# Patient Record
Sex: Female | Born: 1950
Health system: Southern US, Community
[De-identification: ages and names within clinical notes are randomized; demographics above are authoritative.]

## PROBLEM LIST (undated history)

## (undated) DIAGNOSIS — E559 Vitamin D deficiency, unspecified: Secondary | ICD-10-CM

## (undated) DIAGNOSIS — Z860100 Personal history of colon polyps, unspecified: Secondary | ICD-10-CM

## (undated) DIAGNOSIS — J45909 Unspecified asthma, uncomplicated: Secondary | ICD-10-CM

## (undated) DIAGNOSIS — E669 Obesity, unspecified: Secondary | ICD-10-CM

## (undated) DIAGNOSIS — N952 Postmenopausal atrophic vaginitis: Secondary | ICD-10-CM

## (undated) DIAGNOSIS — T8859XA Other complications of anesthesia, initial encounter: Secondary | ICD-10-CM

## (undated) DIAGNOSIS — C801 Malignant (primary) neoplasm, unspecified: Secondary | ICD-10-CM

## (undated) DIAGNOSIS — Z9889 Other specified postprocedural states: Secondary | ICD-10-CM

## (undated) DIAGNOSIS — R112 Nausea with vomiting, unspecified: Secondary | ICD-10-CM

## (undated) DIAGNOSIS — I10 Essential (primary) hypertension: Secondary | ICD-10-CM

## (undated) DIAGNOSIS — F329 Major depressive disorder, single episode, unspecified: Secondary | ICD-10-CM

## (undated) DIAGNOSIS — E119 Type 2 diabetes mellitus without complications: Secondary | ICD-10-CM

## (undated) DIAGNOSIS — E785 Hyperlipidemia, unspecified: Secondary | ICD-10-CM

## (undated) DIAGNOSIS — F32A Depression, unspecified: Secondary | ICD-10-CM

## (undated) DIAGNOSIS — T4145XA Adverse effect of unspecified anesthetic, initial encounter: Secondary | ICD-10-CM

## (undated) HISTORY — PX: FACIAL COSMETIC SURGERY: SHX629

## (undated) HISTORY — DX: Personal history of colonic polyps: Z86.010

## (undated) HISTORY — DX: Essential (primary) hypertension: I10

## (undated) HISTORY — PX: GASTRIC BYPASS: SHX52

## (undated) HISTORY — DX: Obesity, unspecified: E66.9

## (undated) HISTORY — DX: Hyperlipidemia, unspecified: E78.5

## (undated) HISTORY — PX: NASAL SINUS SURGERY: SHX719

## (undated) HISTORY — DX: Major depressive disorder, single episode, unspecified: F32.9

## (undated) HISTORY — PX: ABDOMINAL HYSTERECTOMY: SHX81

## (undated) HISTORY — DX: Type 2 diabetes mellitus without complications: E11.9

## (undated) HISTORY — DX: Personal history of colon polyps, unspecified: Z86.0100

## (undated) HISTORY — DX: Depression, unspecified: F32.A

## (undated) HISTORY — DX: Postmenopausal atrophic vaginitis: N95.2

## (undated) HISTORY — DX: Malignant (primary) neoplasm, unspecified: C80.1

## (undated) HISTORY — DX: Vitamin D deficiency, unspecified: E55.9

## (undated) HISTORY — DX: Unspecified asthma, uncomplicated: J45.909

## (undated) HISTORY — PX: COLONOSCOPY: SHX174

## (undated) HISTORY — PX: BREAST CYST ASPIRATION: SHX578

---
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
## 1998-02-16 ENCOUNTER — Other Ambulatory Visit: Admission: RE | Admit: 1998-02-16 | Discharge: 1998-02-16 | Payer: Self-pay | Admitting: Obstetrics & Gynecology

## 1998-03-29 ENCOUNTER — Ambulatory Visit (HOSPITAL_COMMUNITY): Admission: RE | Admit: 1998-03-29 | Discharge: 1998-03-29 | Payer: Self-pay | Admitting: Obstetrics & Gynecology

## 2001-06-27 ENCOUNTER — Encounter: Payer: Self-pay | Admitting: Family Medicine

## 2001-06-27 ENCOUNTER — Encounter: Admission: RE | Admit: 2001-06-27 | Discharge: 2001-06-27 | Payer: Self-pay | Admitting: Family Medicine

## 2001-08-22 ENCOUNTER — Inpatient Hospital Stay (HOSPITAL_COMMUNITY): Admission: RE | Admit: 2001-08-22 | Discharge: 2001-08-25 | Payer: Self-pay | Admitting: Obstetrics & Gynecology

## 2001-08-22 ENCOUNTER — Encounter (INDEPENDENT_AMBULATORY_CARE_PROVIDER_SITE_OTHER): Payer: Self-pay

## 2002-07-28 ENCOUNTER — Other Ambulatory Visit: Admission: RE | Admit: 2002-07-28 | Discharge: 2002-07-28 | Payer: Self-pay | Admitting: Obstetrics & Gynecology

## 2003-11-06 ENCOUNTER — Other Ambulatory Visit: Admission: RE | Admit: 2003-11-06 | Discharge: 2003-11-06 | Payer: Self-pay | Admitting: Obstetrics & Gynecology

## 2005-03-01 ENCOUNTER — Other Ambulatory Visit: Admission: RE | Admit: 2005-03-01 | Discharge: 2005-03-01 | Payer: Self-pay | Admitting: Obstetrics & Gynecology

## 2005-09-06 ENCOUNTER — Encounter: Payer: Self-pay | Admitting: Obstetrics & Gynecology

## 2007-03-12 ENCOUNTER — Encounter: Admission: RE | Admit: 2007-03-12 | Discharge: 2007-03-12 | Payer: Self-pay | Admitting: Family Medicine

## 2007-06-24 ENCOUNTER — Encounter: Admission: RE | Admit: 2007-06-24 | Discharge: 2007-06-24 | Payer: Self-pay | Admitting: Family Medicine

## 2008-05-07 ENCOUNTER — Encounter: Admission: RE | Admit: 2008-05-07 | Discharge: 2008-05-07 | Payer: Self-pay | Admitting: Family Medicine

## 2009-07-29 ENCOUNTER — Encounter: Admission: RE | Admit: 2009-07-29 | Discharge: 2009-07-29 | Payer: Self-pay | Admitting: Family Medicine

## 2010-02-09 ENCOUNTER — Encounter: Admission: RE | Admit: 2010-02-09 | Discharge: 2010-02-09 | Payer: Self-pay | Admitting: Family Medicine

## 2010-08-18 ENCOUNTER — Other Ambulatory Visit: Payer: Self-pay | Admitting: Family Medicine

## 2010-08-18 DIAGNOSIS — Z1231 Encounter for screening mammogram for malignant neoplasm of breast: Secondary | ICD-10-CM

## 2010-09-05 ENCOUNTER — Ambulatory Visit
Admission: RE | Admit: 2010-09-05 | Discharge: 2010-09-05 | Disposition: A | Discharge status: Home / Self Care | Payer: 59 | Source: Ambulatory Visit | Attending: Family Medicine | Admitting: Family Medicine

## 2010-09-05 DIAGNOSIS — Z1231 Encounter for screening mammogram for malignant neoplasm of breast: Secondary | ICD-10-CM

## 2010-09-23 NOTE — Op Note (Signed)
[facility] of [facility]  Patient:    REILYN, NELSON Visit Number: [id number] MRN: [id number]          Service Type: GYN Location: 9300 [id number] Attending Physician:  Minette Headland Dictated by:   Freddy Finner, M.D. Proc. Date: 08/22/01 Admit Date:  08/22/2001                             Operative Report  OPERATIVE PROCEDURE:          Total abdominal hysterectomy and bilateral salpingo-oophorectomy, with dissection and removal of large left broad ligament leiomyoma.  PREOPERATIVE DIAGNOSIS:       Fibroids.  POSTOPERATIVE DIAGNOSIS:      Fibroids.  SURGEON:                      Freddy Finner, M.D.  ASSISTANTWilley Blade, M.D.  ESTIMATED BLOOD LOSS:         250 cc.  INTRAOPERATIVE COMPLICATIONS: None.  DESCRIPTION OF PROCEDURE:     The patient was admitted on the morning of surgery.  She was given a bolus of Cefotan IV.  She was placed in PASA.  She was brought to the operating room and there placed under adequate general endotracheal anesthesia, placed in the dorsal recumbent position.  Abdomen, perineum and vagina were prepped in the usual fashion.  A Foley catheter was placed using sterile technique.  Sterile drapes were applied.  A transverse incision was made through an old lower abdominal transverse scar. The incision was carried sharply to fascia, which was entered sharply and extended to the extent of skin incision.  Left sheath was developed superiorly and inferiorly with blunt and sharp dissection.  Rectus muscles were divided in the midline. The peritoneum was elevated, entered sharply and extended bluntly and sharply to the extent of skin incision.  Manual exploration of the upper abdomen revealed no apparent abnormalities of kidneys, liver, bowel; appendix was visualized and found to be normal and left in place.  Self retaining Colver-Sullivan retractor was placed.  Broad ligaments were grasped on  either side.  The uterus itself at this prospective appeared to be normal, but there was a large, soft mass in the left broad ligament extending down lateral to the vagina.  The desired conical configuration of vaginal hand was placed to explore the mass and determine the extent of the lesion.  The procedure was then performed the LigaSure system, taking round ligaments as separate pedicles and dividing them.  Taking the infundibulopelvic ligaments and dividing them.  The dissection was carried down on the right side after releasing the bladder sharply from the cervix and left segment.  This was carried down to the level of the uterine arteries without significant difficulty.  On the left side the LigaSure system was used and applied directly adjacent to the mass itself, to develop approximately three pedicles.  The mass was then easily dissected with sharp and blunt dissection and freed up.  This allowed visualization of the uterocervical junction, and the uterus with the attached mass was amputated from the cervix.  The cervix was then grasped with a single-tooth tenaculum.  The bladder was carefully advanced off the cervix.  Cardinal ligament pedicles and uterosacral pedicles were then developed.  The uterosacrals were taken with curved Heaneys  and suture ligated with 0 Monocryl on a Heaney fashion.  This allowed excision of the cervix from the vaginal cuff, which was then closed with figure-of-eights of 0 Monocryl.  Uterosacrals were plicated in the anterior peritoneum and pulled over the apex of the vaginal cuff, with a single interrupted 0 Monocryl suture.  Hemostasis was complete.  Irrigation was carried out.  All packs, needles and instruments were removed; counts were correct.  Abdominal incision was closed in layers.  Running 0 Monocryl was used to close the peritoneum and reapproximate the rectus muscles.  Then 0 PDS was used to close the fascia, running from angle to angle.   Skin was closed with wide skin staples and 1/4 inch Steri-Strips.  The patient tolerated the operative procedure well; was taken to the recovery room in good condition. Dictated by:   Freddy Finner, M.D. Attending Physician:  Minette Headland DD:  08/22/01 TD:  08/22/01 Job: [id number]

## 2010-09-23 NOTE — Discharge Summary (Signed)
[facility] of [facility]  Patient:    Debbie Norris, Debbie Norris Visit Number: [id number] MRN: [id number]          Service Type: GYN Location: 9300 [id number] Attending Physician:  Minette Headland Dictated by:   Freddy Finner, M.D. Admit Date:  08/22/2001 Discharge Date: 08/25/2001                             Discharge Summary  DISCHARGE DIAGNOSES:          1. Large left broad ligament leiomyoma with                                  edema and degenerative changes but benign                                  histology.                               2. Complex cystic adnexal mass possibly                                  consistent with degenerative myoma.  PROCEDURES:                   Total abdominal hysterectomy and bilateral salpingo-oophorectomy.  POSTOPERATIVE COMPLICATIONS:  Ileus, resolved.  CONDITION ON DISCHARGE:       The patient is in satisfactory condition at the time of her discharge.  ACTIVITY:                     She is to have progressively increasing physical activity.  She is to avoid vaginal entry.  She is to take a regular diet.  MEDICATIONS:                  She was given Percocet to be taken every four to six hours as needed for postoperative pain.  She was given Biaxin to be taken 500 mg b.i.d. for an additional five days (this is for her concomitant sinusitis, possible bronchitis).  FOLLOW-UP:                    She is to follow up with me in the office in approximately two weeks.  DISCHARGE INSTRUCTIONS:       She is to call for fever, severe pain, or heavy bleeding.  She is to resume all of her admission medications including her oral hypoglycemics.  HISTORY OF PRESENT ILLNESS, PAST HISTORY, FAMILY HISTORY, REVIEW OF SYSTEMS, PHYSICAL EXAMINATION:         Recorded in the admission note.  PHYSICAL EXAMINATION:         Physical exam is remarkable for marked enlargement of the uterus with a large complex cystic left adnexal mass  and uterine leiomyoma which had markedly changed over a relatively short interval of time.  LABORATORY DATA:              Normal CBC on admission with hemoglobin of 12.6. Postoperatively her hemoglobin was 9.7 on the first postoperative day, with a white count of 12,400.  On the second postoperative day her white count was 11,200 with  a hemoglobin of 9.0.  On admission prothrombin time, PTT, and INR were normal.  Her admission urinalysis was normal.  HOSPITAL COURSE:              The patient was admitted on the morning of surgery.  The above-described operative procedure was accomplished without significant intraoperative difficulty, although the dissection was a little tedious and involved because of the distortion of the left broad ligament, particularly at the level of the uterine artery and cervix.  Postoperatively she did develop a temperature of 101.5 on the early morning of the second postoperative day.  At that time her white count was 11.2.  She had some fine rales at the left base and auscultation of the lungs and was complaining of a green mucous discharge from her nasal passages and with cough and a feeling of congestion.  Her abdomen at that time was also distended with increase in bowel sounds.  It was felt that in addition to an ileus the most likely etiology of her symptoms was pulmonary, and she is known to have frequent upper respiratory infections.  She was started on Biaxin at that time.  By the next day the patient was completely afebrile for 24 hours.  She was ambulating without difficulty, having adequate bowel and bladder function.  Her staples were removed.  The incision was healing well.  She was discharged home with further disposition as noted above. Dictated by:   Freddy Finner, M.D. Attending Physician:  Minette Headland DD:  09/12/01 TD:  09/16/01 Job: [id number]

## 2010-10-11 ENCOUNTER — Other Ambulatory Visit (HOSPITAL_COMMUNITY): Payer: Self-pay | Admitting: Family Medicine

## 2010-10-13 ENCOUNTER — Ambulatory Visit
Admission: RE | Admit: 2010-10-13 | Discharge: 2010-10-13 | Disposition: A | Discharge status: Home / Self Care | Payer: 59 | Source: Ambulatory Visit | Attending: Family Medicine | Admitting: Family Medicine

## 2010-10-13 ENCOUNTER — Other Ambulatory Visit: Payer: Self-pay | Admitting: Family Medicine

## 2010-10-13 DIAGNOSIS — R05 Cough: Secondary | ICD-10-CM

## 2010-11-02 ENCOUNTER — Encounter (HOSPITAL_COMMUNITY): Payer: Self-pay

## 2010-11-02 ENCOUNTER — Encounter (HOSPITAL_COMMUNITY)
Admission: RE | Admit: 2010-11-02 | Discharge: 2010-11-02 | Disposition: A | Discharge status: Home / Self Care | Payer: 59 | Source: Ambulatory Visit | Attending: Family Medicine | Admitting: Family Medicine

## 2010-11-02 DIAGNOSIS — R109 Unspecified abdominal pain: Secondary | ICD-10-CM | POA: Insufficient documentation

## 2010-11-02 MED ORDER — SINCALIDE 5 MCG IJ SOLR: 0.0200 ug/kg | Freq: Once | INTRAMUSCULAR | Status: DC

## 2010-11-02 MED ORDER — TECHNETIUM TC 99M MEBROFENIN IV KIT
5.5000 | PACK | Freq: Once | INTRAVENOUS | Status: AC | PRN
  Administered 2010-11-02: 5.5 via INTRAVENOUS

## 2012-02-01 ENCOUNTER — Other Ambulatory Visit: Payer: Self-pay | Admitting: Family Medicine

## 2012-02-01 DIAGNOSIS — Z1231 Encounter for screening mammogram for malignant neoplasm of breast: Secondary | ICD-10-CM

## 2012-03-13 ENCOUNTER — Ambulatory Visit
Admission: RE | Admit: 2012-03-13 | Discharge: 2012-03-13 | Disposition: A | Discharge status: Home / Self Care | Payer: 59 | Source: Ambulatory Visit | Attending: Family Medicine | Admitting: Family Medicine

## 2012-03-13 DIAGNOSIS — Z1231 Encounter for screening mammogram for malignant neoplasm of breast: Secondary | ICD-10-CM

## 2012-07-15 ENCOUNTER — Other Ambulatory Visit (HOSPITAL_COMMUNITY)
Admission: RE | Admit: 2012-07-15 | Discharge: 2012-07-15 | Disposition: A | Discharge status: Home / Self Care | Payer: 59 | Source: Ambulatory Visit | Attending: Family Medicine | Admitting: Family Medicine

## 2012-07-15 ENCOUNTER — Other Ambulatory Visit: Payer: Self-pay | Admitting: Family Medicine

## 2012-07-15 DIAGNOSIS — Z01419 Encounter for gynecological examination (general) (routine) without abnormal findings: Secondary | ICD-10-CM | POA: Insufficient documentation

## 2013-07-04 ENCOUNTER — Other Ambulatory Visit: Payer: Self-pay

## 2013-07-04 DIAGNOSIS — Z1231 Encounter for screening mammogram for malignant neoplasm of breast: Secondary | ICD-10-CM

## 2013-07-08 ENCOUNTER — Ambulatory Visit
Admission: RE | Admit: 2013-07-08 | Discharge: 2013-07-08 | Disposition: A | Discharge status: Home / Self Care | Payer: 59 | Source: Ambulatory Visit

## 2013-07-08 DIAGNOSIS — Z1231 Encounter for screening mammogram for malignant neoplasm of breast: Secondary | ICD-10-CM

## 2013-09-01 ENCOUNTER — Institutional Professional Consult (permissible substitution): Payer: 59 | Admitting: Internal Medicine

## 2013-09-10 ENCOUNTER — Other Ambulatory Visit: Payer: 59

## 2013-09-10 ENCOUNTER — Encounter: Payer: Self-pay | Admitting: Internal Medicine

## 2013-09-10 ENCOUNTER — Ambulatory Visit (INDEPENDENT_AMBULATORY_CARE_PROVIDER_SITE_OTHER): Payer: 59 | Admitting: Internal Medicine

## 2013-09-10 ENCOUNTER — Encounter (INDEPENDENT_AMBULATORY_CARE_PROVIDER_SITE_OTHER): Payer: Self-pay

## 2013-09-10 ENCOUNTER — Ambulatory Visit (INDEPENDENT_AMBULATORY_CARE_PROVIDER_SITE_OTHER)
Admission: RE | Admit: 2013-09-10 | Discharge: 2013-09-10 | Disposition: A | Discharge status: Home / Self Care | Payer: 59 | Source: Ambulatory Visit | Attending: Internal Medicine | Admitting: Internal Medicine

## 2013-09-10 VITALS — BP 130/86 | HR 78 | Ht 61.0 in | Wt 173.0 lb

## 2013-09-10 DIAGNOSIS — J309 Allergic rhinitis, unspecified: Secondary | ICD-10-CM

## 2013-09-10 DIAGNOSIS — J4540 Moderate persistent asthma, uncomplicated: Secondary | ICD-10-CM

## 2013-09-10 DIAGNOSIS — J45909 Unspecified asthma, uncomplicated: Secondary | ICD-10-CM

## 2013-09-10 DIAGNOSIS — J3089 Other allergic rhinitis: Principal | ICD-10-CM

## 2013-09-10 DIAGNOSIS — K219 Gastro-esophageal reflux disease without esophagitis: Secondary | ICD-10-CM

## 2013-09-10 DIAGNOSIS — J302 Other seasonal allergic rhinitis: Secondary | ICD-10-CM

## 2013-09-10 MED ORDER — MONTELUKAST SODIUM 10 MG PO TABS: 10.0000 mg | ORAL_TABLET | Freq: Every day | ORAL | Status: DC

## 2013-09-10 NOTE — Patient Instructions (Signed)
Order- lab Allergy profile    Dx Asthma   Script for Singulair to try instead of Accolate for comparison  Order CXR- dx Asthma  Order- schedule PFT

## 2013-09-10 NOTE — Progress Notes (Signed)
09/10/13- 62 yoF never smoker COMPLAINS OF:  Referred by Dr. Darcus Austin for Asthma  I had seen her husband Merry Proud for sleep apnea Diagnosed with asthma around 1995 and has been having more trouble this winter in to March. 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 Blames exposure to one room there where anesthetics are used. Chronic asthma had been well controlled until this spring. Advair and Accolate have helped. Has used rescue inhaler 5 or 6 times this spring. Without Accolate her chest gets tight in a day or 2. Triggers for asthma include strong odors, seasonal pollens, sinus infections, and the room at work which she goes into intermittently- experiencing hoarseness and cough. Nasal congestion treated with saline rinse after winter colds. Sinus surgery for mucocoel and polyps but no history of aspirin intolerance. Ipratropium nasal spray if needed. Daily Zyrtec when needed. Significant GERD, has aspirated twice. Takes protonix and sleeps on 3 pillows. Hx bariatric surgery, tonsils. Allergy skin testing positive by Dr Shaune Leeks years ago-never on vaccine. Eating walnuts once made her lips swell. Environment-house, no pets, basement, no mold or water damage  Prior to Admission medications   Medication Sig Start Date End Date Taking? Authorizing Provider  ADVAIR DISKUS 100-50 MCG/DOSE AEPB 2 puffs as needed. 08/13/13  Yes Historical Provider, MD  ALPRAZolam Duanne Moron) 0.5 MG tablet 1 tablet as needed. 08/13/13  Yes Historical Provider, MD  celecoxib (CELEBREX) 200 MG capsule 1 capsule daily. 08/22/13  Yes Historical Provider, MD  CRESTOR 5 MG tablet 1 tablet daily. 08/13/13  Yes Historical Provider, MD  FLUoxetine (PROZAC) 40 MG capsule 1 capsule daily. 08/13/13  Yes Historical Provider, MD  fluticasone (FLONASE) 50 MCG/ACT nasal spray 2 sprays 2 (two) times daily. 07/20/13  Yes Historical Provider, MD  ipratropium (ATROVENT) 0.06 % nasal spray 1 spray as needed.  06/05/13  Yes Historical  Provider, MD  metFORMIN (GLUCOPHAGE-XR) 500 MG 24 hr tablet 1 tablet daily. 08/13/13  Yes Historical Provider, MD  pantoprazole (PROTONIX) 40 MG tablet 1 tablet daily. 08/13/13  Yes Historical Provider, MD  tolterodine (DETROL LA) 4 MG 24 hr capsule 1 capsule daily. 08/13/13  Yes Historical Provider, MD  VAGIFEM 10 MCG TABS vaginal tablet 1 tablet daily. 09/08/13  Yes Historical Provider, MD  VENTOLIN HFA 108 (90 BASE) MCG/ACT inhaler 1 puff daily. 07/23/13  Yes Historical Provider, MD  Vitamin D, Ergocalciferol, (DRISDOL) 50000 UNITS CAPS capsule 1 capsule daily. 08/13/13  Yes Historical Provider, MD  zafirlukast (ACCOLATE) 20 MG tablet 1 tablet 2 (two) times daily. 08/13/13  Yes Historical Provider, MD  zolpidem (AMBIEN) 10 MG tablet 1 tablet daily. 06/30/13  Yes Historical Provider, MD  montelukast (SINGULAIR) 10 MG tablet Take 1 tablet (10 mg total) by mouth at bedtime. 09/10/13   Deneise Lever, MD   Past Medical History  Diagnosis Date  . Abdominal pain   . High blood pressure   . Cancer     basel cell  . Diabetes    Past Surgical History  Procedure Laterality Date  . Abdominal hysterectomy    . Nasal sinus surgery     Family History  Problem Relation Age of Onset  . Emphysema Paternal Aunt   . Heart disease Mother   . Heart disease Father   . Cancer Father     [place] Cell   History   Social History  . Marital Status: Married    Spouse Name: N/A    Number of Children: N/A  .  Years of Education: N/A   Occupational History  . Not on file.   Social History Main Topics  . Smoking status: Never Smoker   . Smokeless tobacco: Not on file  . Alcohol Use: Yes     Comment: social  . Drug Use: No  . Sexual Activity: Not on file   Other Topics Concern  . Not on file   Social History Narrative  . No narrative on file   ROS-see HPI Constitutional:   No-   weight loss, night sweats, fevers, chills, fatigue, lassitude. HEENT:   No-  headaches, +difficulty swallowing, tooth/dental  problems, sore throat,       +Sneezing,+ itching, +ear ache, +nasal congestion, +post nasal drip,  CV:  No-   chest pain, orthopnea, PND, swelling in lower extremities, anasarca,                                                    dizziness, palpitations Resp: + shortness of breath with exertion or at rest.              +  productive cough,  + non-productive cough,  No- coughing up of blood.              No-   change in color of mucus.  No- wheezing.   Skin: No-   rash or lesions. GI:  +  heartburn, indigestion, abdominal pain, nausea, vomiting, diarrhea,                 change in bowel habits, loss of appetite GU: No-   dysuria, change in color of urine, no urgency or frequency.  No- flank pain. MS:  +  joint pain or swelling.  No- decreased range of motion.  No- back pain. Neuro-     nothing unusual Psych:  No- change in mood or affect. + depression or anxiety.  No memory loss.  OBJ- Physical Exam General- Alert, Oriented, Affect-appropriate, Distress- none acute Skin- rash-none, lesions- none, excoriation- none Lymphadenopathy- none Head- atraumatic            Eyes- Gross vision intact, PERRLA, conjunctivae and secretions clear            Ears- Hearing, canals-normal            Nose- +mucosa red, no-Septal dev, mucus, polyps, erosion, perforation             Throat- Mallampati II , mucosa +red , drainage- none, tonsils- atrophic Neck- flexible , trachea midline, no stridor , thyroid nl, carotid no bruit Chest - symmetrical excursion , unlabored           Heart/CV- RRR , no murmur , no gallop  , no rub, nl s1 s2                           - JVD- none , edema- none, stasis changes- none, varices- none           Lung- clear to P&A, wheeze- none, cough- none , dullness-none, rub- none           Chest wall-  Abd- tender-no, distended-no, bowel sounds-present, HSM- no Br/ Gen/ Rectal- Not done, not indicated Extrem- cyanosis- none, clubbing, none, atrophy- none, strength- nl Neuro- grossly  intact to observation

## 2013-09-11 LAB — ALLERGY FULL PROFILE
Allergen,Goose feathers, e70: <0.1 kU/L
Alternaria Alternata: <0.1 kU/L
Bahia Grass: <0.1 kU/L
Bermuda Grass: <0.1 kU/L
Cat Dander: <0.1 kU/L
Curvularia lunata: <0.1 kU/L
Dog Dander: <0.1 kU/L
Fescue: <0.1 kU/L
G005 Rye, Perennial: <0.1 kU/L
Goldenrod: <0.1 kU/L
Helminthosporium halodes: <0.1 kU/L
IgE (Immunoglobulin E), Serum: 12.5 IU/mL (ref 0.0–180.0)
Lamb's Quarters: <0.1 kU/L
Stemphylium Botryosum: <0.1 kU/L
Sycamore Tree: <0.1 kU/L

## 2013-09-13 DIAGNOSIS — J449 Chronic obstructive pulmonary disease, unspecified: Secondary | ICD-10-CM | POA: Insufficient documentation

## 2013-09-13 DIAGNOSIS — J302 Other seasonal allergic rhinitis: Secondary | ICD-10-CM | POA: Insufficient documentation

## 2013-09-13 DIAGNOSIS — K219 Gastro-esophageal reflux disease without esophagitis: Secondary | ICD-10-CM | POA: Insufficient documentation

## 2013-09-13 DIAGNOSIS — J3089 Other allergic rhinitis: Principal | ICD-10-CM

## 2013-09-13 NOTE — Assessment & Plan Note (Signed)
Controlled with Zyrtec Plan-lab for allergy profile

## 2013-09-13 NOTE — Assessment & Plan Note (Addendum)
Controlled with ipratropium and Advair 100. She feels dependent on Accolate. Plan-compare Singulair to accolate, PFT, chest x-ray, GERD precautions

## 2013-09-13 NOTE — Assessment & Plan Note (Signed)
Explained difference between reflux and heartburn, role of acid blockers Plan-elevate head of bed rather than pillows

## 2013-10-17 ENCOUNTER — Telehealth: Payer: Self-pay | Admitting: Internal Medicine

## 2013-10-17 MED ORDER — MONTELUKAST SODIUM 10 MG PO TABS: 10.0000 mg | ORAL_TABLET | Freq: Every day | ORAL | Status: DC

## 2013-10-17 NOTE — Telephone Encounter (Signed)
Called spoke with pt. Aware RX has been sent in. Nothing further needed 

## 2013-10-23 ENCOUNTER — Ambulatory Visit: Payer: 59 | Admitting: Internal Medicine

## 2013-10-31 ENCOUNTER — Encounter (HOSPITAL_COMMUNITY): Payer: Self-pay | Admitting: Emergency Medicine

## 2013-10-31 ENCOUNTER — Emergency Department (HOSPITAL_COMMUNITY): Payer: PRIVATE HEALTH INSURANCE

## 2013-10-31 ENCOUNTER — Emergency Department (HOSPITAL_COMMUNITY)
Admission: EM | Admit: 2013-10-31 | Discharge: 2013-11-01 | Disposition: A | Discharge status: Home / Self Care | Payer: PRIVATE HEALTH INSURANCE | Attending: Emergency Medicine | Admitting: Emergency Medicine

## 2013-10-31 DIAGNOSIS — S0003XA Contusion of scalp, initial encounter: Secondary | ICD-10-CM | POA: Insufficient documentation

## 2013-10-31 DIAGNOSIS — E119 Type 2 diabetes mellitus without complications: Secondary | ICD-10-CM | POA: Insufficient documentation

## 2013-10-31 DIAGNOSIS — Y9210 Unspecified residential institution as the place of occurrence of the external cause: Secondary | ICD-10-CM | POA: Insufficient documentation

## 2013-10-31 DIAGNOSIS — S8002XA Contusion of left knee, initial encounter: Secondary | ICD-10-CM

## 2013-10-31 DIAGNOSIS — Z85828 Personal history of other malignant neoplasm of skin: Secondary | ICD-10-CM | POA: Insufficient documentation

## 2013-10-31 DIAGNOSIS — W1809XA Striking against other object with subsequent fall, initial encounter: Secondary | ICD-10-CM | POA: Insufficient documentation

## 2013-10-31 DIAGNOSIS — S8000XA Contusion of unspecified knee, initial encounter: Secondary | ICD-10-CM | POA: Insufficient documentation

## 2013-10-31 DIAGNOSIS — IMO0002 Reserved for concepts with insufficient information to code with codable children: Secondary | ICD-10-CM | POA: Insufficient documentation

## 2013-10-31 DIAGNOSIS — I10 Essential (primary) hypertension: Secondary | ICD-10-CM | POA: Insufficient documentation

## 2013-10-31 DIAGNOSIS — S0083XA Contusion of other part of head, initial encounter: Secondary | ICD-10-CM | POA: Insufficient documentation

## 2013-10-31 DIAGNOSIS — Y9389 Activity, other specified: Secondary | ICD-10-CM | POA: Insufficient documentation

## 2013-10-31 DIAGNOSIS — Z791 Long term (current) use of non-steroidal anti-inflammatories (NSAID): Secondary | ICD-10-CM | POA: Insufficient documentation

## 2013-10-31 DIAGNOSIS — S1093XA Contusion of unspecified part of neck, initial encounter: Principal | ICD-10-CM

## 2013-10-31 DIAGNOSIS — Z79899 Other long term (current) drug therapy: Secondary | ICD-10-CM | POA: Insufficient documentation

## 2013-10-31 DIAGNOSIS — Y990 Civilian activity done for income or pay: Secondary | ICD-10-CM | POA: Insufficient documentation

## 2013-10-31 NOTE — ED Provider Notes (Signed)
CSN: [id number]     Arrival date & time 10/31/13  2210 History   First MD Initiated Contact with Patient 10/31/13 2242     Chief Complaint  Patient presents with  . Head Injury     (Consider location/radiation/quality/duration/timing/severity/associated sxs/prior Treatment) The history is provided by the patient.  Debbie Norris is a 63 y.o. female hx of HTN, arthritis here with fall. 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 Was able to ambulate afterwards but has knee pain and was noted to have L forehead hematoma. Not on blood thinners. Denies neck pain to me. Tried to break the fall with left hand but denies pain left hand.    Past Medical History  Diagnosis Date  . Abdominal pain   . High blood pressure   . Cancer     basel cell  . Diabetes    Past Surgical History  Procedure Laterality Date  . Abdominal hysterectomy    . Nasal sinus surgery     Family History  Problem Relation Age of Onset  . Emphysema Paternal Aunt   . Heart disease Mother   . Heart disease Father   . Cancer Father     [place] Cell   History  Substance Use Topics  . Smoking status: Never Smoker   . Smokeless tobacco: Not on file  . Alcohol Use: Yes     Comment: social   OB History   Grav Para Term Preterm Abortions TAB SAB Ect Mult Living                 Review of Systems  HENT:       L forehead hematoma   Musculoskeletal:       L knee pain   All other systems reviewed and are negative.     Allergies  Phenergan  Home Medications   Prior to Admission medications   Medication Sig Start Date End Date Taking? Authorizing Provider  ADVAIR DISKUS 100-50 MCG/DOSE AEPB 2 puffs as needed. 08/13/13   Historical Provider, MD  ALPRAZolam Duanne Moron) 0.5 MG tablet 1 tablet as needed. 08/13/13   Historical Provider, MD  celecoxib (CELEBREX) 200 MG capsule 1 capsule daily. 08/22/13   Historical  Provider, MD  CRESTOR 5 MG tablet 1 tablet daily. 08/13/13   Historical Provider, MD  FLUoxetine (PROZAC) 40 MG capsule 1 capsule daily. 08/13/13   Historical Provider, MD  fluticasone (FLONASE) 50 MCG/ACT nasal spray 2 sprays 2 (two) times daily. 07/20/13   Historical Provider, MD  ipratropium (ATROVENT) 0.06 % nasal spray 1 spray as needed.  06/05/13   Historical Provider, MD  metFORMIN (GLUCOPHAGE-XR) 500 MG 24 hr tablet 1 tablet daily. 08/13/13   Historical Provider, MD  montelukast (SINGULAIR) 10 MG tablet Take 1 tablet (10 mg total) by mouth at bedtime. 10/17/13   Deneise Lever, MD  pantoprazole (PROTONIX) 40 MG tablet 1 tablet daily. 08/13/13   Historical Provider, MD  tolterodine (DETROL LA) 4 MG 24 hr capsule 1 capsule daily. 08/13/13   Historical Provider, MD  VAGIFEM 10 MCG TABS vaginal tablet 1 tablet daily. 09/08/13   Historical Provider, MD  VENTOLIN HFA 108 (90 BASE) MCG/ACT inhaler 1 puff daily. 07/23/13   Historical Provider, MD  Vitamin D, Ergocalciferol, (DRISDOL) 50000 UNITS CAPS capsule 1 capsule daily. 08/13/13   Historical Provider, MD  zafirlukast (ACCOLATE) 20 MG tablet 1 tablet  2 (two) times daily. 08/13/13   Historical Provider, MD  zolpidem (AMBIEN) 10 MG tablet 1 tablet daily. 06/30/13   Historical Provider, MD   BP 132/75  Pulse 72  Temp(Src) 98.4 F (36.9 C) (Oral)  Resp 16  SpO2 100% Physical Exam  Nursing note and vitals reviewed. Constitutional: She is oriented to person, place, and time. She appears well-developed and well-nourished.  HENT:  Head: Normocephalic.  Small L forehead hematoma   Eyes: Conjunctivae are normal. Pupils are equal, round, and reactive to light.  Neck: Normal range of motion. Neck supple.  No midline tenderness   Cardiovascular: Normal rate, regular rhythm and normal heart sounds.   Pulmonary/Chest: Effort normal and breath sounds normal.  Abdominal: Soft. Bowel sounds are normal. She exhibits no distension. There is no tenderness. There is no  rebound and no guarding.  Musculoskeletal:  L knee abrasion. Nl ROM, mildly tender. ACL/PCL intact. L hand abrasions, nontender.   Neurological: She is alert and oriented to person, place, and time. No cranial nerve deficit. Coordination normal.  Skin: Skin is warm and dry.  Psychiatric: She has a normal mood and affect. Her behavior is normal. Judgment and thought content normal.    ED Course  Procedures (including critical care time) Labs Review Labs Reviewed - No data to display  Imaging Review Ct Head Wo Contrast  10/31/2013   CLINICAL DATA:  Head injury.  Forehead hematoma.  EXAM: CT HEAD WITHOUT CONTRAST  TECHNIQUE: Contiguous axial images were obtained from the base of the skull through the vertex without intravenous contrast.  COMPARISON:  03/12/2007  FINDINGS: Skull and Sinuses:Negative for fracture or destructive process. Previous endoscopic sinus surgery with medial maxillary antrostomies and left more than right ethmoidectomies. There is minimal scattered mucosal thickening and secretions retention.  Orbits: No acute abnormality.  Brain: No evidence of acute abnormality, such as acute infarction, hemorrhage, hydrocephalus, or mass lesion/mass effect. There is mild generalized cerebral volume loss.  IMPRESSION: No acute intracranial injury.   Electronically Signed   By: Jorje Guild M.D.   On: 10/31/2013 23:58   Dg Knee Complete 4 Views Left  10/31/2013   CLINICAL DATA:  Left knee pain after fall.  EXAM: LEFT KNEE - COMPLETE 4+ VIEW  COMPARISON:  None.  FINDINGS: There is no evidence of fracture, dislocation, or joint effusion. Moderate narrowing of medial joint space is noted. Minimal spurring of patella is noted. Soft tissues are unremarkable.  IMPRESSION: Moderate degenerative joint disease is noted medially. No acute abnormality seen in the left knee.   Electronically Signed   By: Sabino Dick M.D.   On: 10/31/2013 23:53   Dg Hand Complete Left  10/31/2013   CLINICAL DATA:   Left hand pain after fall.  EXAM: LEFT HAND - COMPLETE 3+ VIEW  COMPARISON:  None.  FINDINGS: There is no evidence of fracture or dislocation. There is no evidence of arthropathy or other focal bone abnormality. Soft tissues are unremarkable.  IMPRESSION: Normal left hand.   Electronically Signed   By: Sabino Dick M.D.   On: 10/31/2013 23:51     EKG Interpretation None      MDM   Final diagnoses:  None   Debbie Norris is a 63 y.o. female here with fall. Will get CT head, knee xray, hand xray.   12:02 AM CT head, xray nl. Ambulating on the knee so I doubt tibial plateau fracture. Stable for d/c.     Wandra Arthurs, MD 11/01/13  0002 

## 2013-10-31 NOTE — ED Notes (Addendum)
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 Abrasions to left knee, left hand and hematoma to left forehead. denies LOC.  Pt is alert and oriented. Denies nausea. Reports neck soreness.  PT reports taking celebrex, but not blodd thinner.

## 2013-10-31 NOTE — ED Notes (Signed)
Pt alert, NAD, calm, interactive, resps e/u, speaking in clear complete sentences, sitting upright in stretcher, no dyspnea noted. To CT/xray. Pt mentions L ulnar hand pain at PIP. Abrasion, swelling and bruising noted.

## 2013-11-01 NOTE — ED Notes (Signed)
Declines w/c

## 2013-11-01 NOTE — Discharge Instructions (Signed)
Continue taking celebrex for pain.   You can also take tylenol for pain.   Keep leg elevated.   Follow up with your doctor.   Return to ER if you have severe pain, unable to walk, headaches, vomiting.

## 2013-11-01 NOTE — ED Notes (Signed)
The tech cleaned patient's abrasions on the left side: elbow, hand, finger and the knee. The tech has reported to the RN in charge.

## 2013-11-25 ENCOUNTER — Ambulatory Visit (INDEPENDENT_AMBULATORY_CARE_PROVIDER_SITE_OTHER): Payer: 59 | Admitting: Internal Medicine

## 2013-11-25 ENCOUNTER — Encounter: Payer: Self-pay | Admitting: Internal Medicine

## 2013-11-25 VITALS — BP 122/62 | HR 93 | Ht 61.0 in | Wt 165.0 lb

## 2013-11-25 DIAGNOSIS — J3089 Other allergic rhinitis: Secondary | ICD-10-CM

## 2013-11-25 DIAGNOSIS — J4540 Moderate persistent asthma, uncomplicated: Secondary | ICD-10-CM

## 2013-11-25 DIAGNOSIS — J302 Other seasonal allergic rhinitis: Secondary | ICD-10-CM

## 2013-11-25 DIAGNOSIS — J45909 Unspecified asthma, uncomplicated: Secondary | ICD-10-CM

## 2013-11-25 DIAGNOSIS — J309 Allergic rhinitis, unspecified: Secondary | ICD-10-CM

## 2013-11-25 DIAGNOSIS — R011 Cardiac murmur, unspecified: Secondary | ICD-10-CM | POA: Insufficient documentation

## 2013-11-25 LAB — PULMONARY FUNCTION TEST
DL/VA % PRED: 114 %
DL/VA: 5.02 ml/min/mmHg/L
DLCO UNC: 22.39 ml/min/mmHg
DLCO unc % pred: 110 %
FEF 25-75 PRE: 2.84 L/s
FEF 25-75 Post: 2.81 L/sec
FEF2575-%CHANGE-POST: -1 %
FEF2575-%PRED-PRE: 135 %
FEF2575-%Pred-Post: 134 %
FEV1-%Change-Post: -1 %
FEV1-%PRED-PRE: 113 %
FEV1-%Pred-Post: 112 %
FEV1-POST: 2.5 L
FEV1-PRE: 2.53 L
FEV1FVC-%CHANGE-POST: -1 %
FEV1FVC-%Pred-Pre: 104 %
FEV6-%CHANGE-POST: 0 %
FEV6-%Pred-Post: 110 %
FEV6-%Pred-Pre: 109 %
FEV6-Post: 3.06 L
FEV6-Pre: 3.04 L
FEV6FVC-%Change-Post: 0 %
FEV6FVC-%Pred-Post: 102 %
FEV6FVC-%Pred-Pre: 101 %
FVC-%Change-Post: 0 %
FVC-%PRED-POST: 107 %
FVC-%Pred-Pre: 107 %
FVC-Post: 3.1 L
FVC-Pre: 3.1 L
POST FEV1/FVC RATIO: 80 %
PRE FEV1/FVC RATIO: 81 %
Post FEV6/FVC ratio: 99 %
Pre FEV6/FVC Ratio: 98 %
RV % pred: 44 %
RV: 0.83 L
TLC % pred: 107 %
TLC: 4.94 L

## 2013-11-25 MED ORDER — ALBUTEROL SULFATE HFA 108 (90 BASE) MCG/ACT IN AERS: INHALATION_SPRAY | RESPIRATORY_TRACT | Status: DC

## 2013-11-25 NOTE — Progress Notes (Signed)
09/10/13- 62 yoF never smoker COMPLAINS OF:  Referred by Dr. Darcus Austin for Asthma  I had seen her husband Merry Proud for sleep apnea Diagnosed with asthma around 1995 and has been having more trouble this winter in to March. 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 Blames exposure to one room there where anesthetics are used. Chronic asthma had been well controlled until this spring. Advair and Accolate have helped. Has used rescue inhaler 5 or 6 times this spring. Without Accolate her chest gets tight in a day or 2. Triggers for asthma include strong odors, seasonal pollens, sinus infections, and the room at work which she goes into intermittently- experiencing hoarseness and cough. Nasal congestion treated with saline rinse after winter colds. Sinus surgery for mucocoel and polyps but no history of aspirin intolerance. Ipratropium nasal spray if needed. Daily Zyrtec when needed. Significant GERD, has aspirated twice. Takes protonix and sleeps on 3 pillows. Hx bariatric surgery, tonsils. Allergy skin testing positive by Dr Shaune Leeks years ago-never on vaccine. Eating walnuts once made her lips swell. Environment-house, no pets, basement, no mold or water damage  11/25/13- 63 yoF never smoker COMPLAINS OF:  Referred by Dr. Darcus Austin for Asthma, allergic rhinitis, complicated by GERD  I had seen her husband Merry Proud for sleep apnea FOLLOWS FOR: Pt states her breathing is doing pretty good; coughing up white colored phlegm for a couple of months now-this is new-never had previously(mainly in AM but could go through out the day). Review PFT results with patient. She tends to expect an asthma flare each fall and spring with season change. Allergy Profile-09/10/2013-negative-total IgE 12.5 with no specific elevations PFT 11/25/13-within normal limits with insignificant response to bronchodilator CXR 09/10/13-  IMPRESSION: No active cardiopulmonary disease.  Electronically Signed  By: Marcello Moores  Register  On: 09/10/2013 13:38   ROS-see HPI Constitutional:   No-   weight loss, night sweats, fevers, chills, fatigue, lassitude. HEENT:   No-  headaches, +difficulty swallowing, tooth/dental problems, sore throat,       Sneezing, itching, +ear ache, nasal congestion, post nasal drip,  CV:  No-   chest pain, orthopnea, PND, swelling in lower extremities, anasarca,                                                    dizziness, palpitations Resp: no-shortness of breath with exertion or at rest.              +productive cough, no-non-productive cough,  No- coughing up of blood.              No-   change in color of mucus.  No- wheezing.   Skin: No-   rash or lesions. GI:  No-heartburn, indigestion, abdominal pain, nausea, vomiting, diarrhea,                 change in bowel habits, loss of appetite GU: No-   dysuria, change in color of urine, no urgency or frequency.  No- flank pain. MS:  +  joint pain or swelling.  No- decreased range of motion.  No- back pain. Neuro-     nothing unusual Psych:  No- change in mood or affect. + depression or anxiety.  No memory loss.  OBJ- Physical Exam General- Alert, Oriented, Affect-appropriate, Distress- none acute Skin- rash-none, lesions- none, excoriation-  none Lymphadenopathy- none Head- atraumatic            Eyes- Gross vision intact, PERRLA, conjunctivae and secretions clear            Ears- Hearing, canals-normal            Nose- mucosa normal, no-Septal dev, mucus, polyps, erosion, perforation             Throat- Mallampati II , mucosa +red , drainage- none, tonsils- atrophic Neck- flexible , trachea midline, no stridor , thyroid nl, carotid no bruit Chest - symmetrical excursion , unlabored           Heart/CV- RRR ,  Murmur +2/6 syst LUSB , no gallop  , no rub, nl s1 s2                           - JVD- none , edema- none, stasis changes- none, varices- none           Lung- clear to P&A, wheeze- none, cough- none , dullness-none, rub- none            Chest wall-  Abd-  Br/ Gen/ Rectal- Not done, not indicated Extrem- cyanosis- none, clubbing, none, atrophy- none, strength- nl Neuro- grossly intact to observation

## 2013-11-25 NOTE — Progress Notes (Signed)
PFT done today. 

## 2013-11-25 NOTE — Assessment & Plan Note (Signed)
Asthma control is much improved with normal PFTs. We discussed her seasonal pattern which may reflect virus infection given the normal allergy profile results. For now she is comfortable off of her maintenance controller Advair but continues Singulair and has a rescue inhaler Plan-scheduled return in December

## 2013-11-25 NOTE — Assessment & Plan Note (Signed)
I did not notice this murmur last visit and she was unaware of it. She has had previous echocardiogram with results available to her primary physician. I doubt this is hemodynamically significant

## 2013-11-25 NOTE — Assessment & Plan Note (Signed)
Controlled now in mid-summer

## 2013-11-25 NOTE — Patient Instructions (Addendum)
Script sent refilling rescue inhaler  Ok to try off Advair and Flonase.    Restart if you see trouble coming, before you get really bad.  Please call as needed

## 2014-01-14 ENCOUNTER — Ambulatory Visit (HOSPITAL_COMMUNITY): Payer: 59 | Attending: Family Medicine

## 2014-01-14 ENCOUNTER — Other Ambulatory Visit (HOSPITAL_COMMUNITY): Payer: Self-pay | Admitting: Family Medicine

## 2014-01-14 DIAGNOSIS — R011 Cardiac murmur, unspecified: Secondary | ICD-10-CM | POA: Diagnosis present

## 2014-01-14 DIAGNOSIS — J45909 Unspecified asthma, uncomplicated: Secondary | ICD-10-CM | POA: Insufficient documentation

## 2014-01-14 DIAGNOSIS — K219 Gastro-esophageal reflux disease without esophagitis: Secondary | ICD-10-CM | POA: Insufficient documentation

## 2014-01-14 DIAGNOSIS — I517 Cardiomegaly: Secondary | ICD-10-CM | POA: Diagnosis not present

## 2014-01-14 DIAGNOSIS — I379 Nonrheumatic pulmonary valve disorder, unspecified: Secondary | ICD-10-CM | POA: Diagnosis not present

## 2014-01-14 DIAGNOSIS — I359 Nonrheumatic aortic valve disorder, unspecified: Secondary | ICD-10-CM | POA: Insufficient documentation

## 2014-01-14 NOTE — Progress Notes (Signed)
2D Echo completed. 01/14/2014

## 2014-01-15 ENCOUNTER — Telehealth: Payer: Self-pay | Admitting: Internal Medicine

## 2014-01-15 MED ORDER — MONTELUKAST SODIUM 10 MG PO TABS: 10.0000 mg | ORAL_TABLET | Freq: Every day | ORAL | Status: DC

## 2014-01-15 NOTE — Telephone Encounter (Signed)
Called spoke with pt. She is aware RX has been sent in. Nothing further needed

## 2014-01-16 ENCOUNTER — Other Ambulatory Visit: Payer: Self-pay | Admitting: Internal Medicine

## 2014-04-22 ENCOUNTER — Ambulatory Visit: Payer: Self-pay | Admitting: Internal Medicine

## 2014-04-23 ENCOUNTER — Other Ambulatory Visit: Payer: Self-pay | Admitting: Family Medicine

## 2014-04-23 DIAGNOSIS — R1310 Dysphagia, unspecified: Secondary | ICD-10-CM

## 2014-04-24 ENCOUNTER — Encounter (INDEPENDENT_AMBULATORY_CARE_PROVIDER_SITE_OTHER): Payer: Self-pay

## 2014-04-24 ENCOUNTER — Ambulatory Visit
Admission: RE | Admit: 2014-04-24 | Discharge: 2014-04-24 | Disposition: A | Discharge status: Home / Self Care | Payer: 59 | Source: Ambulatory Visit | Attending: Family Medicine | Admitting: Family Medicine

## 2014-04-24 DIAGNOSIS — R1310 Dysphagia, unspecified: Secondary | ICD-10-CM

## 2014-06-03 ENCOUNTER — Ambulatory Visit: Payer: Self-pay | Admitting: Internal Medicine

## 2014-06-10 ENCOUNTER — Encounter: Payer: Self-pay | Admitting: Internal Medicine

## 2014-06-10 ENCOUNTER — Ambulatory Visit (INDEPENDENT_AMBULATORY_CARE_PROVIDER_SITE_OTHER): Payer: 59 | Admitting: Internal Medicine

## 2014-06-10 VITALS — BP 124/62 | HR 94 | Ht 61.0 in | Wt 168.8 lb

## 2014-06-10 DIAGNOSIS — J309 Allergic rhinitis, unspecified: Secondary | ICD-10-CM

## 2014-06-10 DIAGNOSIS — J4520 Mild intermittent asthma, uncomplicated: Secondary | ICD-10-CM

## 2014-06-10 DIAGNOSIS — J302 Other seasonal allergic rhinitis: Secondary | ICD-10-CM

## 2014-06-10 DIAGNOSIS — R011 Cardiac murmur, unspecified: Secondary | ICD-10-CM

## 2014-06-10 DIAGNOSIS — J3089 Other allergic rhinitis: Secondary | ICD-10-CM

## 2014-06-10 MED ORDER — MONTELUKAST SODIUM 10 MG PO TABS: ORAL_TABLET | ORAL | Status: DC

## 2014-06-10 NOTE — Patient Instructions (Signed)
Script sent for singulair refill  Ok to continue present meds, including Advair  Please call if we can help

## 2014-06-10 NOTE — Progress Notes (Signed)
09/10/13- 62 yoF never smoker COMPLAINS OF:  Referred by Dr. Darcus Austin for Asthma  I had seen her husband Merry Proud for sleep apnea Diagnosed with asthma around 1995 and has been having more trouble this winter in to March. 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 Blames exposure to one room there where anesthetics are used. Chronic asthma had been well controlled until this spring. Advair and Accolate have helped. Has used rescue inhaler 5 or 6 times this spring. Without Accolate her chest gets tight in a day or 2. Triggers for asthma include strong odors, seasonal pollens, sinus infections, and the room at work which she goes into intermittently- experiencing hoarseness and cough. Nasal congestion treated with saline rinse after winter colds. Sinus surgery for mucocoel and polyps but no history of aspirin intolerance. Ipratropium nasal spray if needed. Daily Zyrtec when needed. Significant GERD, has aspirated twice. Takes protonix and sleeps on 3 pillows. Hx bariatric surgery, tonsils. Allergy skin testing positive by Dr Shaune Leeks years ago-never on vaccine. Eating walnuts once made her lips swell. Environment-house, no pets, basement, no mold or water damage  11/25/13- 63 yoF never smoker COMPLAINS OF:  Referred by Dr. Darcus Austin for Asthma, allergic rhinitis, complicated by GERD  I had seen her husband Merry Proud for sleep apnea FOLLOWS FOR: Pt states her breathing is doing pretty good; coughing up white colored phlegm for a couple of months now-this is new-never had previously(mainly in AM but could go through out the day). Review PFT results with patient. She tends to expect an asthma flare each fall and spring with season change. Allergy Profile-09/10/2013-negative-total IgE 12.5 with no specific elevations PFT 11/25/13-within normal limits with insignificant response to bronchodilator CXR 09/10/13-  IMPRESSION: No active cardiopulmonary disease.  Electronically Signed  By: Marcello Moores  Register  On: 09/10/2013 13:38   06/10/14-63 yoF never smoker followed for Asthma, allergic rhinitis, complicated by GERD, Aortic Regurgitation FOLLOW FOR:  allergies are doing pretty well, started back on flonase.  Had a cold about a month ago, still spitting up about a teaspoon at a time of thick mucus, sometimes colored yellow or green, but mostly white.  coughing, sometimes causing metal taste in mouth Restarted Flonase ECHO showed Aortic Regurg  ROS-see HPI Constitutional:   No-   weight loss, night sweats, fevers, chills, fatigue, lassitude. HEENT:   No-  headaches, +difficulty swallowing, tooth/dental problems, sore throat,       Sneezing, itching, +ear ache, nasal congestion, post nasal drip,  CV:  No-   chest pain, orthopnea, PND, swelling in lower extremities, anasarca,                                                    dizziness, palpitations Resp: no-shortness of breath with exertion or at rest.              +productive cough, no-non-productive cough,  No- coughing up of blood.              No-   change in color of mucus.  No- wheezing.   Skin: No-   rash or lesions. GI:  No-heartburn, indigestion, abdominal pain, nausea, vomiting, diarrhea,                 change in bowel habits, loss of appetite GU: No-   dysuria, change  in color of urine, no urgency or frequency.  No- flank pain. MS:  +  joint pain or swelling.  No- decreased range of motion.  No- back pain. Neuro-     nothing unusual Psych:  No- change in mood or affect. + depression or anxiety.  No memory loss.  OBJ- Physical Exam General- Alert, Oriented, Affect-appropriate, Distress- none acute Skin- rash-none, lesions- none, excoriation- none Lymphadenopathy- none Head- atraumatic            Eyes- Gross vision intact, PERRLA, conjunctivae and secretions clear            Ears- Hearing, canals-normal            Nose- mucosa normal, no-Septal dev, mucus, polyps, erosion, perforation             Throat- Mallampati II ,  mucosa +red , drainage- none, tonsils- atrophic Neck- flexible , trachea midline, no stridor , thyroid nl, carotid no bruit Chest - symmetrical excursion , unlabored           Heart/CV- RRR ,  Murmur +2/6 syst A.I. , no gallop  , no rub, nl s1 s2                           - JVD- none , edema- none, stasis changes- none, varices- none           Lung- clear to P&A, wheeze- none, cough+slight , dullness-none, rub- none           Chest wall-  Abd-  Br/ Gen/ Rectal- Not done, not indicated Extrem- cyanosis- none, clubbing, none, atrophy- none, strength- nl Neuro- grossly intact to observation

## 2014-06-11 ENCOUNTER — Other Ambulatory Visit (HOSPITAL_COMMUNITY): Payer: Self-pay | Admitting: Gastroenterology

## 2014-06-11 DIAGNOSIS — R1310 Dysphagia, unspecified: Secondary | ICD-10-CM

## 2014-06-12 ENCOUNTER — Telehealth: Payer: Self-pay | Admitting: Internal Medicine

## 2014-06-15 NOTE — Telephone Encounter (Signed)
Called and spoke to pt. Pt stated she went to an UC over the weekend and received prednisone and an abx. Pt stated she is now barely getting her voice back and starting to feel better. Pt denied any further questions or concerns at this time.

## 2014-06-18 ENCOUNTER — Ambulatory Visit (HOSPITAL_COMMUNITY): Admission: RE | Admit: 2014-06-18 | Payer: 59 | Source: Ambulatory Visit

## 2014-06-18 ENCOUNTER — Other Ambulatory Visit (HOSPITAL_COMMUNITY): Payer: Self-pay

## 2014-06-22 NOTE — Assessment & Plan Note (Signed)
Slow to resolve winter chest cold, but doing much better. Plan- Advair renew

## 2014-06-22 NOTE — Assessment & Plan Note (Signed)
Flonase plus occ antihistamine should be sufficient Plan watch as spring approaches

## 2014-06-22 NOTE — Assessment & Plan Note (Signed)
Mild AI noted for documentation

## 2014-06-24 ENCOUNTER — Ambulatory Visit (HOSPITAL_COMMUNITY)
Admission: RE | Admit: 2014-06-24 | Discharge: 2014-06-24 | Disposition: A | Discharge status: Home / Self Care | Payer: 59 | Source: Ambulatory Visit | Attending: Gastroenterology | Admitting: Gastroenterology

## 2014-06-24 DIAGNOSIS — R1310 Dysphagia, unspecified: Secondary | ICD-10-CM | POA: Diagnosis present

## 2014-07-13 ENCOUNTER — Other Ambulatory Visit: Payer: Self-pay | Admitting: Occupational Medicine

## 2014-07-13 ENCOUNTER — Ambulatory Visit: Payer: Self-pay

## 2014-07-13 DIAGNOSIS — R52 Pain, unspecified: Secondary | ICD-10-CM

## 2014-09-23 ENCOUNTER — Encounter: Payer: Self-pay | Admitting: Internal Medicine

## 2014-09-23 ENCOUNTER — Ambulatory Visit (INDEPENDENT_AMBULATORY_CARE_PROVIDER_SITE_OTHER): Payer: 59 | Admitting: Internal Medicine

## 2014-09-23 VITALS — BP 110/60 | HR 100 | Ht 61.0 in | Wt 164.0 lb

## 2014-09-23 DIAGNOSIS — J449 Chronic obstructive pulmonary disease, unspecified: Secondary | ICD-10-CM

## 2014-09-23 DIAGNOSIS — K219 Gastro-esophageal reflux disease without esophagitis: Secondary | ICD-10-CM

## 2014-09-23 MED ORDER — FLUCONAZOLE 150 MG PO TABS: ORAL_TABLET | ORAL | Status: DC

## 2014-09-23 MED ORDER — FLUTICASONE-SALMETEROL 250-50 MCG/DOSE IN AEPB: INHALATION_SPRAY | RESPIRATORY_TRACT | Status: DC

## 2014-09-23 MED ORDER — AZITHROMYCIN 250 MG PO TABS: ORAL_TABLET | ORAL | Status: DC

## 2014-09-23 NOTE — Assessment & Plan Note (Signed)
She has recognized reflux and at least one aspiration event. Modified barium swallow noted upper esophageal dysmotility without definite reflux or laryngeal penetration. Plan-recommended elevation of head of bed.

## 2014-09-23 NOTE — Assessment & Plan Note (Signed)
She recognizes that Advair helps. Because of her upcoming long vacation with plane flights, we're going to increase Advair to 250/50 and give her Z-Pak to carry with Diflucan at her request.

## 2014-09-23 NOTE — Progress Notes (Signed)
09/10/13- 62 yoF never smoker COMPLAINS OF:  Referred by Dr. Darcus Austin for Asthma  I had seen her husband Merry Proud for sleep apnea Diagnosed with asthma around 1995 and has been having more trouble this winter in to March. 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 Blames exposure to one room there where anesthetics are used. Chronic asthma had been well controlled until this spring. Advair and Accolate have helped. Has used rescue inhaler 5 or 6 times this spring. Without Accolate her chest gets tight in a day or 2. Triggers for asthma include strong odors, seasonal pollens, sinus infections, and the room at work which she goes into intermittently- experiencing hoarseness and cough. Nasal congestion treated with saline rinse after winter colds. Sinus surgery for mucocoel and polyps but no history of aspirin intolerance. Ipratropium nasal spray if needed. Daily Zyrtec when needed. Significant GERD, has aspirated twice. Takes protonix and sleeps on 3 pillows. Hx bariatric surgery, tonsils. Allergy skin testing positive by Dr Shaune Leeks years ago-never on vaccine. Eating walnuts once made her lips swell. Environment-house, no pets, basement, no mold or water damage  11/25/13- 63 yoF never smoker COMPLAINS OF:  Referred by Dr. Darcus Austin for Asthma, allergic rhinitis, complicated by GERD  I had seen her husband Merry Proud for sleep apnea FOLLOWS FOR: Pt states her breathing is doing pretty good; coughing up white colored phlegm for a couple of months now-this is new-never had previously(mainly in AM but could go through out the day). Review PFT results with patient. She tends to expect an asthma flare each fall and spring with season change. Allergy Profile-09/10/2013-negative-total IgE 12.5 with no specific elevations PFT 11/25/13-within normal limits with insignificant response to bronchodilator CXR 09/10/13-  IMPRESSION: No active cardiopulmonary disease.  Electronically Signed  By: Marcello Moores  Register  On: 09/10/2013 13:38   06/10/14-63 yoF never smoker followed for Asthma, allergic rhinitis, complicated by GERD, Aortic Regurgitation FOLLOW FOR:  allergies are doing pretty well, started back on flonase.  Had a cold about a month ago, still spitting up about a teaspoon at a time of thick mucus, sometimes colored yellow or green, but mostly white.  coughing, sometimes causing metal taste in mouth Restarted Flonase ECHO showed Aortic Regurg  09/23/14-64 yoF never smoker followed for Asthma, allergic rhinitis, complicated by GERD, Aortic Regurgitation Follows for No tight chest feeling; forceful cough with thick white mucus  Persistent cough, gradually worse over several months. Her GI doctor sent her for swallowing evaluation with upper esophageal dysfunction but no frank aspiration. She recognizes occasional reflux and at least one aspiration event. Going to [place] for 23 days, leaving next week. Says she gets bronchitis after airplane flights. We discussed management options. Chest x-ray and PFT were okay. She reports she is better off with Advair. CXR 07/13/14 IMPRESSION: No acute cardiopulmonary abnormality. Electronically Signed  By: Genevie Ann M.D.  On: 07/13/2014 14:54  ROS-see HPI Constitutional:   No-   weight loss, night sweats, fevers, chills, fatigue, lassitude. HEENT:   No-  headaches, +difficulty swallowing, tooth/dental problems, sore throat,       Sneezing, itching, +ear ache, nasal congestion, post nasal drip,  CV:  No-   chest pain, orthopnea, PND, swelling in lower extremities, anasarca,  dizziness, palpitations Resp: no-shortness of breath with exertion or at rest.              +productive cough, +non-productive cough,  No- coughing up of blood.              No-   change in color of mucus.  No- wheezing.   Skin: No-   rash or lesions. GI:  No-heartburn, indigestion, abdominal pain, nausea, vomiting, GU:  MS:  +   joint pain or swelling.  No- decreased range of motion.  No- back pain. Neuro-     nothing unusual Psych:  No- change in mood or affect. + depression or anxiety.  No memory loss.  OBJ- Physical Exam General- Alert, Oriented, Affect-appropriate, Distress- none acute Skin- rash-none, lesions- none, excoriation- none Lymphadenopathy- none Head- atraumatic            Eyes- Gross vision intact, PERRLA, conjunctivae and secretions clear            Ears- Hearing, canals-normal            Nose- mucosa normal, no-Septal dev, mucus, polyps, erosion, perforation             Throat- Mallampati II , mucosa clear, drainage- none, tonsils- atrophic Neck- flexible , trachea midline, no stridor , thyroid nl, carotid no bruit Chest - symmetrical excursion , unlabored           Heart/CV- RRR ,  Murmur +1/6 syst A.I. , no gallop  , no rub, nl s1 s2                           - JVD- none , edema- none, stasis changes- none, varices- none           Lung- clear to P&A, wheeze- none, cough+slight , dullness-none, rub- none           Chest wall-  Abd-  Br/ Gen/ Rectal- Not done, not indicated Extrem- cyanosis- none, clubbing, none, atrophy- none, strength- nl Neuro- grossly intact to observation

## 2014-09-23 NOTE — Patient Instructions (Addendum)
Script for Zpak with extra to carry  Scfript for diflucan  Script to increase Advair to 250/ 50 1 pff, then rinse, twice daily  Suggest you raise the head of your bedframe on a brick under each head leg, to reduce chance of reflux at night

## 2014-11-03 ENCOUNTER — Telehealth: Payer: Self-pay | Admitting: Internal Medicine

## 2014-11-03 MED ORDER — FLUTICASONE-SALMETEROL 250-50 MCG/DOSE IN AEPB: INHALATION_SPRAY | RESPIRATORY_TRACT | Status: DC

## 2014-11-03 MED ORDER — MONTELUKAST SODIUM 10 MG PO TABS: ORAL_TABLET | ORAL | Status: DC

## 2014-11-03 NOTE — Telephone Encounter (Signed)
Spoke with pt. Confirmed RX's needed. These have been sent to the pharmacy. Nothing further needed

## 2014-12-09 ENCOUNTER — Ambulatory Visit: Payer: Self-pay | Admitting: Internal Medicine

## 2014-12-14 ENCOUNTER — Other Ambulatory Visit: Payer: Self-pay | Admitting: Internal Medicine

## 2014-12-18 ENCOUNTER — Ambulatory Visit
Admission: RE | Admit: 2014-12-18 | Discharge: 2014-12-18 | Disposition: A | Discharge status: Home / Self Care | Payer: 59 | Source: Ambulatory Visit

## 2014-12-18 ENCOUNTER — Other Ambulatory Visit: Payer: Self-pay

## 2014-12-18 DIAGNOSIS — Z1231 Encounter for screening mammogram for malignant neoplasm of breast: Secondary | ICD-10-CM

## 2014-12-23 ENCOUNTER — Other Ambulatory Visit (HOSPITAL_COMMUNITY): Payer: Self-pay | Admitting: Family Medicine

## 2014-12-23 DIAGNOSIS — I351 Nonrheumatic aortic (valve) insufficiency: Secondary | ICD-10-CM

## 2014-12-24 ENCOUNTER — Other Ambulatory Visit (HOSPITAL_COMMUNITY): Payer: Self-pay

## 2014-12-29 ENCOUNTER — Other Ambulatory Visit: Payer: Self-pay

## 2014-12-29 ENCOUNTER — Ambulatory Visit (HOSPITAL_COMMUNITY): Payer: 59 | Attending: Cardiovascular Disease

## 2014-12-29 DIAGNOSIS — I371 Nonrheumatic pulmonary valve insufficiency: Secondary | ICD-10-CM | POA: Insufficient documentation

## 2014-12-29 DIAGNOSIS — I517 Cardiomegaly: Secondary | ICD-10-CM | POA: Insufficient documentation

## 2014-12-29 DIAGNOSIS — I071 Rheumatic tricuspid insufficiency: Secondary | ICD-10-CM | POA: Diagnosis not present

## 2014-12-29 DIAGNOSIS — I359 Nonrheumatic aortic valve disorder, unspecified: Secondary | ICD-10-CM | POA: Diagnosis present

## 2014-12-29 DIAGNOSIS — I340 Nonrheumatic mitral (valve) insufficiency: Secondary | ICD-10-CM | POA: Insufficient documentation

## 2014-12-29 DIAGNOSIS — I351 Nonrheumatic aortic (valve) insufficiency: Secondary | ICD-10-CM | POA: Diagnosis not present

## 2015-03-17 ENCOUNTER — Ambulatory Visit: Payer: Self-pay | Admitting: Cardiology

## 2015-03-25 ENCOUNTER — Encounter: Payer: Self-pay | Admitting: Internal Medicine

## 2015-03-25 ENCOUNTER — Ambulatory Visit (INDEPENDENT_AMBULATORY_CARE_PROVIDER_SITE_OTHER): Payer: 59 | Admitting: Internal Medicine

## 2015-03-25 VITALS — BP 142/60 | HR 74 | Ht 61.0 in | Wt 160.0 lb

## 2015-03-25 DIAGNOSIS — R011 Cardiac murmur, unspecified: Secondary | ICD-10-CM | POA: Diagnosis not present

## 2015-03-25 NOTE — Progress Notes (Signed)
Cardiology Office Note   Date:  03/25/2015   ID:  OPEL MANSI, DOB 1950-11-26, MRN [id number]  PCP:  Marjorie Smolder, MD  Cardiologist:   [name], MD    Establish for f/u of AI     History of Present Illness: Debbie Norris is a 64 y.o. female with a history of Aortic insufficiency Pt followed by Darcus Austin  Had echo done 2015, 2016 that showed moderate AI. Pt has history of PVCs in past  Felt due to meds (Seldane, Vivelle)  Had stress test years ago in [place] that was normal by her report  She denies CP  Breathing is OK Denies palpitations   Meds would cause PVCs  Seldane  In 2014 stopped vivelle dot  Heart racing   Stress test done OK  Per her report  ([facility])      Current Outpatient Prescriptions  Medication Sig Dispense Refill  . ALPRAZolam (XANAX) 0.5 MG tablet Take 0.5 mg by mouth daily as needed for anxiety.    Marland Kitchen azithromycin (ZITHROMAX) 250 MG tablet 2 today then one daily as directed 18 tablet 3  . celecoxib (CELEBREX) 200 MG capsule Take 200 mg by mouth daily.    . cyclobenzaprine (FLEXERIL) 10 MG tablet Take 1 tablet by mouth 3 (three) times daily as needed.    . fluconazole (DIFLUCAN) 150 MG tablet TAKE 1 TABLET BY MOUTH X2 DAYS THEN AS DIRECTED 10 tablet 0  . FLUoxetine (PROZAC) 40 MG capsule Take 40 mg by mouth daily.    . fluticasone (FLONASE) 50 MCG/ACT nasal spray Place 2 sprays into both nostrils daily.    . Fluticasone-Salmeterol (ADVAIR DISKUS) 250-50 MCG/DOSE AEPB 1 puff then rinse mouth, twice daily 90 each 3  . HYDROcodone-acetaminophen (NORCO/VICODIN) 5-325 MG tablet Take 1-2 tablets by mouth every 6 (six) hours as needed for moderate pain.     Marland Kitchen ipratropium (ATROVENT) 0.06 % nasal spray Place 2 sprays into both nostrils daily.    . metFORMIN (GLUCOPHAGE-XR) 500 MG 24 hr tablet Take 2,000 mg by mouth at bedtime.     . montelukast (SINGULAIR) 10 MG tablet 1 daily 90 tablet 3  . ondansetron (ZOFRAN-ODT) 4 MG disintegrating tablet  Take 4 mg by mouth every 8 (eight) hours as needed for nausea or vomiting.     . ONE TOUCH ULTRA TEST test strip 1 each by Other route as needed for other.   3  . pantoprazole (PROTONIX) 40 MG tablet Take 40 mg by mouth 2 (two) times daily at 10 am and 4 pm.     . Probiotic Product (ALIGN PO) Take by mouth daily.    Marland Kitchen PROVENTIL HFA 108 (90 BASE) MCG/ACT inhaler TAKE 2 PUFFS BY MOUTH EVERY 4 TO 6 HOURS AS NEEDED 6.7 Inhaler 11  . rosuvastatin (CRESTOR) 5 MG tablet Take 5 mg by mouth daily.    Marland Kitchen tolterodine (DETROL LA) 4 MG 24 hr capsule Take 1 capsule by mouth daily.     Marland Kitchen VAGIFEM 10 MCG TABS vaginal tablet Place 1 tablet vaginally 2 (two) times a week.   3  . Vitamin D, Ergocalciferol, (DRISDOL) 50000 UNITS CAPS capsule Take 50,000 Units by mouth daily. Sundays    . zolpidem (AMBIEN) 10 MG tablet Take 5 mg by mouth at bedtime as needed for sleep.      No current facility-administered medications for this visit.    Allergies:   Bextra; Lipitor; Other; Phenergan; Pineapple; and Prinivil   Past Medical  History  Diagnosis Date  . Abdominal pain   . High blood pressure   . Cancer ([place])     basel cell  . Diabetes ([place])   . Atrophic vaginitis   . Obesity     GASTRIC BYPASS IN 2006  . Depression   . Asthma   . Hyperlipidemia   . Vitamin D deficiency   . Hx of colonic polyps     Past Surgical History  Procedure Laterality Date  . Abdominal hysterectomy    . Nasal sinus surgery    . Facial cosmetic surgery    . Colonoscopy    . Gastric bypass       Social History:  The patient  reports that she has never smoked. She does not have any smokeless tobacco history on file. She reports that she drinks alcohol. She reports that she does not use illicit drugs.   Family History:  The patient's family history includes Cancer in her father; Emphysema in her paternal aunt; Heart disease in her father and mother.    ROS:  Please see the history of present illness. All other systems are  reviewed and  Negative to the above problem except as noted.    PHYSICAL EXAM: VS:  BP 142/60 mmHg  Pulse 74  Ht 5\' 1"  (1.549 m)  Wt 72.576 kg (160 lb)  BMI 30.25 kg/m2  GEN: Well nourished, well developed, in no acute distress HEENT: normal Neck: no JVD, carotid bruits, or masses Cardiac: RRR;  Gr I/VI systolic murmur LSB  No, rubs, or gallops,no edema  Respiratory:  clear to auscultation bilaterally, normal work of breathing GI: soft, nontender, nondistended, + BS  No hepatomegaly  MS: no deformity Moving all extremities   Skin: warm and dry, no rash Neuro:  Strength and sensation are intact Psych: euthymic mood, full affect   EKG:  EKG is ordered today.  SR 74  RBBB.  LAFB  Poor r wave progression.    Lipid Panel No results found for: CHOL, TRIG, HDL, CHOLHDL, VLDL, LDLCALC, LDLDIRECT    Wt Readings from Last 3 Encounters:  03/25/15 72.576 kg (160 lb)  09/23/14 74.39 kg (164 lb)  06/10/14 76.567 kg (168 lb 12.8 oz)      ASSESSMENT AND PLAN:  1  Aortic insufficencly  Moderate on echo  This is stable  LVEF is normal  LV size is normal  I would followe up in 1 year with echo and visit.    2  Abnormal EKG Follow  Pt doing well .   3  HL  On Crestor a few days per wk  Has some achiness in toes.  Follow  If she cant tolerate then would try Livalo.       Signed, [name], MD  03/25/2015 11:28 AM    [address], [place], [place]  [address] Phone: [phone]; Fax: [phone]

## 2015-03-25 NOTE — Patient Instructions (Signed)
Your physician recommends that you continue on your current medications as directed. Please refer to the Current Medication list given to you today. **call if you decide to stop the Crestor and start the Livalo and let us know how you are doing.**  Your physician wants you to follow-up in: 1 year with Dr. Harrington Challenger and echocardiogram just prior to your appointment. You will receive a reminder letter in the mail two months in advance. If you don't receive a letter, please call our office to schedule the follow-up appointment. Your physician has requested that you have an echocardiogram. Echocardiography is a painless test that uses sound waves to create images of your heart. It provides your doctor with information about the size and shape of your heart and how well your heart's chambers and valves are working. This procedure takes approximately one hour. There are no restrictions for this procedure.

## 2015-04-23 ENCOUNTER — Telehealth: Payer: Self-pay | Admitting: Internal Medicine

## 2015-04-23 NOTE — Telephone Encounter (Signed)
Patient calling the office for samples of medication:   1.  What medication and dosage are you requesting samples for?Livalo    2.  Are you currently out of this medication? Pt having started taking it yet.

## 2015-04-23 NOTE — Telephone Encounter (Signed)
Patient calling the office for samples of medication: 1.  What medication and dosage are you requesting samples for? Livalo   2.  Are you currently out of this medication? Pt having started taking it yet.    Your physician recommends that you continue on your current medications as directed. Please refer to the Current Medication list given to you today. **call if you decide to stop the Crestor and start the Livalo and let us know how you are doing.**  03/25/15 OV  Pt called wanting samples of livalo due to cramp from the statin crestor, Will send to [facility] to get the dosage for the Iivalo So we can leave her samples.  If no samples wants script please, pt is leaving for [place] and want be back until end of jan

## 2015-04-26 MED ORDER — PITAVASTATIN CALCIUM 1 MG PO TABS: 1.0000 mg | ORAL_TABLET | Freq: Every day | ORAL | Status: DC

## 2015-04-26 NOTE — Telephone Encounter (Signed)
Spoke with patient. She has been taking Crestor 3 times per week and developing muscle cramping and she is unable to tolerate. Discussed Livalo at recently OV with Dr. Harrington Challenger. Ok per Dr. Harrington Challenger to start Livalo 1 mg. She is going to take it about 3 times per week the first 1-2 weeks.  She will let us know how she is doing with this medication; She is going out of town soon.   If she tolerates she will want a 90 day supply.

## 2015-09-13 ENCOUNTER — Other Ambulatory Visit: Payer: Self-pay | Admitting: Internal Medicine

## 2015-10-13 ENCOUNTER — Ambulatory Visit: Payer: Self-pay | Admitting: Internal Medicine

## 2015-10-27 ENCOUNTER — Encounter: Payer: Self-pay | Admitting: Internal Medicine

## 2015-10-27 ENCOUNTER — Ambulatory Visit (INDEPENDENT_AMBULATORY_CARE_PROVIDER_SITE_OTHER): Payer: 59 | Admitting: Internal Medicine

## 2015-10-27 VITALS — BP 138/68 | HR 94 | Ht 61.0 in | Wt 159.0 lb

## 2015-10-27 DIAGNOSIS — J302 Other seasonal allergic rhinitis: Secondary | ICD-10-CM

## 2015-10-27 DIAGNOSIS — J309 Allergic rhinitis, unspecified: Secondary | ICD-10-CM

## 2015-10-27 DIAGNOSIS — J45909 Unspecified asthma, uncomplicated: Secondary | ICD-10-CM

## 2015-10-27 DIAGNOSIS — J449 Chronic obstructive pulmonary disease, unspecified: Secondary | ICD-10-CM

## 2015-10-27 DIAGNOSIS — R011 Cardiac murmur, unspecified: Secondary | ICD-10-CM

## 2015-10-27 DIAGNOSIS — J3089 Other allergic rhinitis: Secondary | ICD-10-CM

## 2015-10-27 NOTE — Assessment & Plan Note (Signed)
Has done very well over the past year. Never used needs rescue inhaler and using Advair once daily. Plan-okay to try off of Advair or to use it once daily, anticipating she may notice changes with seasons. I think she will be comfortably followed by her primary physician. We will be happy to see her again if we can help.

## 2015-10-27 NOTE — Assessment & Plan Note (Signed)
Aortic regurgitation murmur still evident. She is being followed by cardiology for this.

## 2015-10-27 NOTE — Progress Notes (Signed)
09/10/13- 65 yoF never smoker COMPLAINS OF:  Referred by Dr. Darcus Austin for Asthma  I had seen her husband Merry Proud for sleep apnea Diagnosed with asthma around 1995 and has been having more trouble this winter in to March. 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 Blames exposure to one room there where anesthetics are used. Chronic asthma had been well controlled until this spring. Advair and Accolate have helped. Has used rescue inhaler 5 or 6 times this spring. Without Accolate her chest gets tight in a day or 2. Triggers for asthma include strong odors, seasonal pollens, sinus infections, and the room at work which she goes into intermittently- experiencing hoarseness and cough. Nasal congestion treated with saline rinse after winter colds. Sinus surgery for mucocoel and polyps but no history of aspirin intolerance. Ipratropium nasal spray if needed. Daily Zyrtec when needed. Significant GERD, has aspirated twice. Takes protonix and sleeps on 3 pillows. Hx bariatric surgery, tonsils. Allergy skin testing positive by Dr Shaune Leeks years ago-never on vaccine. Eating walnuts once made her lips swell. Environment-house, no pets, basement, no mold or water damage  11/25/13- 65 yoF never smoker COMPLAINS OF:  Referred by Dr. Darcus Austin for Asthma, allergic rhinitis, complicated by GERD  I had seen her husband Merry Proud for sleep apnea FOLLOWS FOR: Pt states her breathing is doing pretty good; coughing up white colored phlegm for a couple of months now-this is new-never had previously(mainly in AM but could go through out the day). Review PFT results with patient. She tends to expect an asthma flare each fall and spring with season change. Allergy Profile-09/10/2013-negative-total IgE 12.5 with no specific elevations PFT 11/25/13-within normal limits with insignificant response to bronchodilator CXR 09/10/13-  IMPRESSION: No active cardiopulmonary disease.  Electronically Signed  By: Marcello Moores  Register  On: 09/10/2013 13:38   06/10/14-65 yoF never smoker followed for Asthma, allergic rhinitis, complicated by GERD, Aortic Regurgitation FOLLOW FOR:  allergies are doing pretty well, started back on flonase.  Had a cold about a month ago, still spitting up about a teaspoon at a time of thick mucus, sometimes colored yellow or green, but mostly white.  coughing, sometimes causing metal taste in mouth Restarted Flonase ECHO showed Aortic Regurg  09/23/14-65 yoF never smoker followed for Asthma, allergic rhinitis, complicated by GERD, Aortic Regurgitation Follows for No tight chest feeling; forceful cough with thick white mucus Persistent cough, gradually worse over several months. Her GI doctor sent her for swallowing evaluation with upper esophageal dysfunction but no frank aspiration. She recognizes occasional reflux and at least one aspiration event. Going to [place] for 23 days, leaving next week. Says she gets bronchitis after airplane flights. We discussed management options. Chest x-ray and PFT were okay. She reports she is better off with Advair. CXR 07/13/14 IMPRESSION: No acute cardiopulmonary abnormality. Electronically Signed  By: Genevie Ann M.D.  On: 07/13/2014 14:54  10/27/2015-65 year old female never smoker followed for Asthma, allergic rhinitis, complicated by GERD, aortic regurgitation, DM Follows for: Asthma. Pt states that her breathing is doing well. She does c/o occasional cough with white mucus. She denies any recent asthma flares. She is taking medications as directed. She denies any recent rescue inhaler use.  Still using Advair but asks about reducing this.  ROS-see HPI Constitutional:   No-   weight loss, night sweats, fevers, chills, fatigue, lassitude. HEENT:   No-  headaches, +difficulty swallowing, tooth/dental problems, sore throat,       Sneezing, itching, +ear ache, nasal  congestion, post nasal drip,  CV:  No-   chest pain, orthopnea, PND, swelling in lower  extremities, anasarca,                                                                          dizziness, palpitations Resp: no-shortness of breath with exertion or at rest.              productive cough, non-productive cough,  No- coughing up of blood.              No-   change in color of mucus.  No- wheezing.   Skin: No-   rash or lesions. GI:  No-heartburn, indigestion, abdominal pain, nausea, vomiting, GU:  MS:  +  joint pain or swelling.  No- decreased range of motion.  No- back pain. Neuro-     nothing unusual Psych:  No- change in mood or affect. + depression or anxiety.  No memory loss.  OBJ- Physical Exam General- Alert, Oriented, Affect-appropriate, Distress- none acute Skin- rash-none, lesions- none, excoriation- none Lymphadenopathy- none Head- atraumatic            Eyes- Gross vision intact, PERRLA, conjunctivae and secretions clear            Ears- Hearing, canals-normal            Nose- mucosa normal, no-Septal dev, mucus, polyps, erosion, perforation             Throat- Mallampati II , mucosa clear, drainage- none, tonsils- atrophic Neck- flexible , trachea midline, no stridor , thyroid nl, carotid no bruit Chest - symmetrical excursion , unlabored           Heart/CV- RRR ,  Murmur +1/6 syst A.I. , no gallop  , no rub, nl s1 s2                           - JVD- none , edema- none, stasis changes- none, varices- none           Lung- clear to P&A, wheeze- none, cough+slight , dullness-none, rub- none           Chest wall-  Abd-  Br/ Gen/ Rectal- Not done, not indicated Extrem- cyanosis- none, clubbing, none, atrophy- none, strength- nl Neuro- grossly intact to observation

## 2015-10-27 NOTE — Patient Instructions (Signed)
Ok to try using Advair just once daily, and eventually trying without it if you feel comfortable  Ok to continue  Singulair, and to resume use of an albuterol rescue inhaler if needed  Please call if we can help

## 2015-10-27 NOTE — Assessment & Plan Note (Signed)
Has noticed some seasonal exacerbation controlled with antihistamines

## 2015-11-05 ENCOUNTER — Ambulatory Visit: Payer: Self-pay | Admitting: Internal Medicine

## 2015-12-20 ENCOUNTER — Other Ambulatory Visit: Payer: Self-pay | Admitting: Family Medicine

## 2015-12-20 DIAGNOSIS — Z1231 Encounter for screening mammogram for malignant neoplasm of breast: Secondary | ICD-10-CM

## 2016-01-05 ENCOUNTER — Other Ambulatory Visit: Payer: Self-pay | Admitting: Family Medicine

## 2016-01-05 ENCOUNTER — Encounter (INDEPENDENT_AMBULATORY_CARE_PROVIDER_SITE_OTHER): Payer: Self-pay

## 2016-01-05 ENCOUNTER — Other Ambulatory Visit: Payer: Self-pay

## 2016-01-05 ENCOUNTER — Telehealth: Payer: Self-pay | Admitting: Internal Medicine

## 2016-01-05 ENCOUNTER — Ambulatory Visit
Admission: RE | Admit: 2016-01-05 | Discharge: 2016-01-05 | Disposition: A | Discharge status: Home / Self Care | Payer: 59 | Source: Ambulatory Visit | Attending: Family Medicine | Admitting: Family Medicine

## 2016-01-05 ENCOUNTER — Other Ambulatory Visit (HOSPITAL_COMMUNITY): Payer: Self-pay

## 2016-01-05 ENCOUNTER — Ambulatory Visit (HOSPITAL_COMMUNITY): Payer: 59 | Attending: Cardiovascular Disease

## 2016-01-05 DIAGNOSIS — I119 Hypertensive heart disease without heart failure: Secondary | ICD-10-CM | POA: Insufficient documentation

## 2016-01-05 DIAGNOSIS — Z1231 Encounter for screening mammogram for malignant neoplasm of breast: Secondary | ICD-10-CM

## 2016-01-05 DIAGNOSIS — I071 Rheumatic tricuspid insufficiency: Secondary | ICD-10-CM | POA: Diagnosis not present

## 2016-01-05 DIAGNOSIS — E669 Obesity, unspecified: Secondary | ICD-10-CM | POA: Insufficient documentation

## 2016-01-05 DIAGNOSIS — E119 Type 2 diabetes mellitus without complications: Secondary | ICD-10-CM | POA: Insufficient documentation

## 2016-01-05 DIAGNOSIS — I493 Ventricular premature depolarization: Secondary | ICD-10-CM | POA: Diagnosis not present

## 2016-01-05 DIAGNOSIS — Z6830 Body mass index (BMI) 30.0-30.9, adult: Secondary | ICD-10-CM | POA: Insufficient documentation

## 2016-01-05 DIAGNOSIS — I340 Nonrheumatic mitral (valve) insufficiency: Secondary | ICD-10-CM | POA: Diagnosis not present

## 2016-01-05 DIAGNOSIS — E785 Hyperlipidemia, unspecified: Secondary | ICD-10-CM | POA: Insufficient documentation

## 2016-01-05 DIAGNOSIS — I351 Nonrheumatic aortic (valve) insufficiency: Secondary | ICD-10-CM

## 2016-01-05 DIAGNOSIS — I359 Nonrheumatic aortic valve disorder, unspecified: Secondary | ICD-10-CM | POA: Diagnosis present

## 2016-01-05 NOTE — Telephone Encounter (Signed)
Debbie Norris was original schedule to see Debbie Norris back in November of 2016 as a new patient and had to reschedule the appointment due to she was going  out of town. Office was unable to reschedule with Debbie Norris due to his schedule was way out. She was schedule to see  Debbie Norris.   She want to switch to Debbie Norris for the upcoming follow up yearly appointment.  Debbie Norris and her husband had meet Debbie Norris last year.  She is in the recall for a appointment in November.  Patient had her echo done today.

## 2016-01-05 NOTE — Telephone Encounter (Signed)
Maryruth Bun, MD

## 2016-02-17 ENCOUNTER — Ambulatory Visit: Payer: Self-pay | Admitting: Internal Medicine

## 2016-03-02 ENCOUNTER — Encounter: Payer: Self-pay | Admitting: Cardiology

## 2016-03-16 ENCOUNTER — Encounter (INDEPENDENT_AMBULATORY_CARE_PROVIDER_SITE_OTHER): Payer: Self-pay

## 2016-03-16 ENCOUNTER — Encounter: Payer: Self-pay | Admitting: Cardiology

## 2016-03-16 ENCOUNTER — Ambulatory Visit (INDEPENDENT_AMBULATORY_CARE_PROVIDER_SITE_OTHER): Payer: 59 | Admitting: Cardiology

## 2016-03-16 VITALS — BP 120/80 | HR 69 | Ht 61.0 in | Wt 158.2 lb

## 2016-03-16 DIAGNOSIS — I452 Bifascicular block: Secondary | ICD-10-CM

## 2016-03-16 DIAGNOSIS — I493 Ventricular premature depolarization: Secondary | ICD-10-CM | POA: Diagnosis not present

## 2016-03-16 DIAGNOSIS — I444 Left anterior fascicular block: Secondary | ICD-10-CM | POA: Diagnosis not present

## 2016-03-16 DIAGNOSIS — I351 Nonrheumatic aortic (valve) insufficiency: Secondary | ICD-10-CM | POA: Diagnosis not present

## 2016-03-16 DIAGNOSIS — I4510 Unspecified right bundle-branch block: Secondary | ICD-10-CM | POA: Insufficient documentation

## 2016-03-16 NOTE — Patient Instructions (Signed)
Medication Instructions:  The current medical regimen is effective;  continue present plan and medications.  Follow-Up: Follow up in 1 year with Dr. Marlou Porch.  You will receive a letter in the mail 2 months before you are due.  Please call us when you receive this letter to schedule your follow up appointment.  If you need a refill on your cardiac medications before your next appointment, please call your pharmacy.  Thank you for choosing [facility]!!    Recommend Pravastatin 10 mg a day.
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
## 2016-09-07 DIAGNOSIS — M25611 Stiffness of right shoulder, not elsewhere classified: Secondary | ICD-10-CM | POA: Diagnosis not present

## 2016-09-07 DIAGNOSIS — S46091D Other injury of muscle(s) and tendon(s) of the rotator cuff of right shoulder, subsequent encounter: Secondary | ICD-10-CM | POA: Diagnosis not present

## 2016-09-07 DIAGNOSIS — M25511 Pain in right shoulder: Secondary | ICD-10-CM | POA: Diagnosis not present

## 2016-09-11 DIAGNOSIS — S46091D Other injury of muscle(s) and tendon(s) of the rotator cuff of right shoulder, subsequent encounter: Secondary | ICD-10-CM | POA: Diagnosis not present

## 2016-09-11 DIAGNOSIS — M25611 Stiffness of right shoulder, not elsewhere classified: Secondary | ICD-10-CM | POA: Diagnosis not present

## 2016-09-11 DIAGNOSIS — M25511 Pain in right shoulder: Secondary | ICD-10-CM | POA: Diagnosis not present

## 2016-09-14 DIAGNOSIS — M25611 Stiffness of right shoulder, not elsewhere classified: Secondary | ICD-10-CM | POA: Diagnosis not present

## 2016-09-14 DIAGNOSIS — M25511 Pain in right shoulder: Secondary | ICD-10-CM | POA: Diagnosis not present

## 2016-09-14 DIAGNOSIS — S46091D Other injury of muscle(s) and tendon(s) of the rotator cuff of right shoulder, subsequent encounter: Secondary | ICD-10-CM | POA: Diagnosis not present

## 2016-09-18 DIAGNOSIS — M25511 Pain in right shoulder: Secondary | ICD-10-CM | POA: Diagnosis not present

## 2016-09-18 DIAGNOSIS — M25611 Stiffness of right shoulder, not elsewhere classified: Secondary | ICD-10-CM | POA: Diagnosis not present

## 2016-09-18 DIAGNOSIS — S46091D Other injury of muscle(s) and tendon(s) of the rotator cuff of right shoulder, subsequent encounter: Secondary | ICD-10-CM | POA: Diagnosis not present

## 2016-09-19 DIAGNOSIS — E119 Type 2 diabetes mellitus without complications: Secondary | ICD-10-CM | POA: Diagnosis not present

## 2016-09-20 DIAGNOSIS — Z0000 Encounter for general adult medical examination without abnormal findings: Secondary | ICD-10-CM | POA: Diagnosis not present

## 2016-09-20 DIAGNOSIS — E559 Vitamin D deficiency, unspecified: Secondary | ICD-10-CM | POA: Diagnosis not present

## 2016-09-20 DIAGNOSIS — Z7984 Long term (current) use of oral hypoglycemic drugs: Secondary | ICD-10-CM | POA: Diagnosis not present

## 2016-09-20 DIAGNOSIS — I10 Essential (primary) hypertension: Secondary | ICD-10-CM | POA: Diagnosis not present

## 2016-09-20 DIAGNOSIS — H1012 Acute atopic conjunctivitis, left eye: Secondary | ICD-10-CM | POA: Diagnosis not present

## 2016-09-20 DIAGNOSIS — N3281 Overactive bladder: Secondary | ICD-10-CM | POA: Diagnosis not present

## 2016-09-20 DIAGNOSIS — Z23 Encounter for immunization: Secondary | ICD-10-CM | POA: Diagnosis not present

## 2016-09-20 DIAGNOSIS — K219 Gastro-esophageal reflux disease without esophagitis: Secondary | ICD-10-CM | POA: Diagnosis not present

## 2016-09-20 DIAGNOSIS — R21 Rash and other nonspecific skin eruption: Secondary | ICD-10-CM | POA: Diagnosis not present

## 2016-09-20 DIAGNOSIS — E1165 Type 2 diabetes mellitus with hyperglycemia: Secondary | ICD-10-CM | POA: Diagnosis not present

## 2016-09-20 DIAGNOSIS — E782 Mixed hyperlipidemia: Secondary | ICD-10-CM | POA: Diagnosis not present

## 2016-09-20 DIAGNOSIS — F329 Major depressive disorder, single episode, unspecified: Secondary | ICD-10-CM | POA: Diagnosis not present

## 2016-09-20 DIAGNOSIS — I351 Nonrheumatic aortic (valve) insufficiency: Secondary | ICD-10-CM | POA: Diagnosis not present

## 2016-09-20 DIAGNOSIS — R300 Dysuria: Secondary | ICD-10-CM | POA: Diagnosis not present

## 2016-09-21 DIAGNOSIS — M8588 Other specified disorders of bone density and structure, other site: Secondary | ICD-10-CM | POA: Diagnosis not present

## 2016-09-22 DIAGNOSIS — M25611 Stiffness of right shoulder, not elsewhere classified: Secondary | ICD-10-CM | POA: Diagnosis not present

## 2016-09-22 DIAGNOSIS — M25511 Pain in right shoulder: Secondary | ICD-10-CM | POA: Diagnosis not present

## 2016-09-22 DIAGNOSIS — S46091D Other injury of muscle(s) and tendon(s) of the rotator cuff of right shoulder, subsequent encounter: Secondary | ICD-10-CM | POA: Diagnosis not present

## 2016-09-26 ENCOUNTER — Other Ambulatory Visit: Payer: Self-pay | Admitting: Internal Medicine

## 2016-09-26 DIAGNOSIS — M75121 Complete rotator cuff tear or rupture of right shoulder, not specified as traumatic: Secondary | ICD-10-CM | POA: Diagnosis not present

## 2016-09-26 DIAGNOSIS — M65331 Trigger finger, right middle finger: Secondary | ICD-10-CM | POA: Diagnosis not present

## 2016-09-26 DIAGNOSIS — M25512 Pain in left shoulder: Secondary | ICD-10-CM | POA: Diagnosis not present

## 2016-09-26 DIAGNOSIS — M79644 Pain in right finger(s): Secondary | ICD-10-CM | POA: Diagnosis not present

## 2016-09-26 DIAGNOSIS — M6530 Trigger finger, unspecified finger: Secondary | ICD-10-CM | POA: Diagnosis not present

## 2016-09-28 DIAGNOSIS — M25512 Pain in left shoulder: Secondary | ICD-10-CM | POA: Diagnosis not present

## 2016-10-03 DIAGNOSIS — M19011 Primary osteoarthritis, right shoulder: Secondary | ICD-10-CM | POA: Diagnosis not present

## 2016-10-03 DIAGNOSIS — M25511 Pain in right shoulder: Secondary | ICD-10-CM | POA: Diagnosis not present

## 2016-10-03 DIAGNOSIS — M75121 Complete rotator cuff tear or rupture of right shoulder, not specified as traumatic: Secondary | ICD-10-CM | POA: Diagnosis not present

## 2016-10-03 DIAGNOSIS — M25512 Pain in left shoulder: Secondary | ICD-10-CM | POA: Diagnosis not present

## 2016-10-04 DIAGNOSIS — M25611 Stiffness of right shoulder, not elsewhere classified: Secondary | ICD-10-CM | POA: Diagnosis not present

## 2016-10-04 DIAGNOSIS — S46091D Other injury of muscle(s) and tendon(s) of the rotator cuff of right shoulder, subsequent encounter: Secondary | ICD-10-CM | POA: Diagnosis not present

## 2016-10-04 DIAGNOSIS — M25511 Pain in right shoulder: Secondary | ICD-10-CM | POA: Diagnosis not present

## 2016-10-08 ENCOUNTER — Other Ambulatory Visit: Payer: Self-pay | Admitting: Internal Medicine

## 2016-10-25 DIAGNOSIS — J3089 Other allergic rhinitis: Secondary | ICD-10-CM | POA: Diagnosis not present

## 2016-10-25 DIAGNOSIS — J069 Acute upper respiratory infection, unspecified: Secondary | ICD-10-CM | POA: Diagnosis not present

## 2016-10-27 DIAGNOSIS — S46091D Other injury of muscle(s) and tendon(s) of the rotator cuff of right shoulder, subsequent encounter: Secondary | ICD-10-CM | POA: Diagnosis not present

## 2016-10-27 DIAGNOSIS — M25511 Pain in right shoulder: Secondary | ICD-10-CM | POA: Diagnosis not present

## 2016-10-27 DIAGNOSIS — M25611 Stiffness of right shoulder, not elsewhere classified: Secondary | ICD-10-CM | POA: Diagnosis not present

## 2016-11-01 DIAGNOSIS — S46091D Other injury of muscle(s) and tendon(s) of the rotator cuff of right shoulder, subsequent encounter: Secondary | ICD-10-CM | POA: Diagnosis not present

## 2016-11-01 DIAGNOSIS — M25611 Stiffness of right shoulder, not elsewhere classified: Secondary | ICD-10-CM | POA: Diagnosis not present

## 2016-11-01 DIAGNOSIS — M25511 Pain in right shoulder: Secondary | ICD-10-CM | POA: Diagnosis not present

## 2016-11-09 DIAGNOSIS — M25511 Pain in right shoulder: Secondary | ICD-10-CM | POA: Diagnosis not present

## 2016-11-09 DIAGNOSIS — S46091D Other injury of muscle(s) and tendon(s) of the rotator cuff of right shoulder, subsequent encounter: Secondary | ICD-10-CM | POA: Diagnosis not present

## 2016-11-09 DIAGNOSIS — M25611 Stiffness of right shoulder, not elsewhere classified: Secondary | ICD-10-CM | POA: Diagnosis not present

## 2016-11-15 DIAGNOSIS — M25511 Pain in right shoulder: Secondary | ICD-10-CM | POA: Diagnosis not present

## 2016-11-15 DIAGNOSIS — M25611 Stiffness of right shoulder, not elsewhere classified: Secondary | ICD-10-CM | POA: Diagnosis not present

## 2016-11-15 DIAGNOSIS — S46091D Other injury of muscle(s) and tendon(s) of the rotator cuff of right shoulder, subsequent encounter: Secondary | ICD-10-CM | POA: Diagnosis not present

## 2016-11-24 DIAGNOSIS — M25611 Stiffness of right shoulder, not elsewhere classified: Secondary | ICD-10-CM | POA: Diagnosis not present

## 2016-11-24 DIAGNOSIS — M25511 Pain in right shoulder: Secondary | ICD-10-CM | POA: Diagnosis not present

## 2016-11-24 DIAGNOSIS — S46091D Other injury of muscle(s) and tendon(s) of the rotator cuff of right shoulder, subsequent encounter: Secondary | ICD-10-CM | POA: Diagnosis not present

## 2016-12-05 ENCOUNTER — Other Ambulatory Visit: Payer: Self-pay | Admitting: Family Medicine

## 2016-12-05 DIAGNOSIS — Z1231 Encounter for screening mammogram for malignant neoplasm of breast: Secondary | ICD-10-CM

## 2016-12-18 ENCOUNTER — Ambulatory Visit: Payer: Self-pay | Admitting: Internal Medicine

## 2017-01-02 ENCOUNTER — Ambulatory Visit (INDEPENDENT_AMBULATORY_CARE_PROVIDER_SITE_OTHER): Payer: Medicare Other | Admitting: Internal Medicine

## 2017-01-02 ENCOUNTER — Other Ambulatory Visit (INDEPENDENT_AMBULATORY_CARE_PROVIDER_SITE_OTHER): Payer: Medicare Other

## 2017-01-02 ENCOUNTER — Encounter: Payer: Self-pay | Admitting: Internal Medicine

## 2017-01-02 VITALS — BP 116/80 | HR 81 | Ht 61.0 in | Wt 159.6 lb

## 2017-01-02 DIAGNOSIS — J449 Chronic obstructive pulmonary disease, unspecified: Secondary | ICD-10-CM | POA: Diagnosis not present

## 2017-01-02 DIAGNOSIS — J4540 Moderate persistent asthma, uncomplicated: Secondary | ICD-10-CM | POA: Diagnosis not present

## 2017-01-02 DIAGNOSIS — J0101 Acute recurrent maxillary sinusitis: Secondary | ICD-10-CM | POA: Insufficient documentation

## 2017-01-02 LAB — CBC WITH DIFFERENTIAL/PLATELET
Basophils Absolute: 0.1 10*3/uL (ref 0.0–0.1)
Basophils Relative: 1 % (ref 0.0–3.0)
EOS ABS: 0.2 10*3/uL (ref 0.0–0.7)
EOS PCT: 3.5 % (ref 0.0–5.0)
HCT: 38.4 % (ref 36.0–46.0)
HEMOGLOBIN: 12.5 g/dL (ref 12.0–15.0)
LYMPHS ABS: 2.1 10*3/uL (ref 0.7–4.0)
Lymphocytes Relative: 29.8 % (ref 12.0–46.0)
MCHC: 32.6 g/dL (ref 30.0–36.0)
MCV: 86.7 fl (ref 78.0–100.0)
MONO ABS: 0.6 10*3/uL (ref 0.1–1.0)
Monocytes Relative: 7.9 % (ref 3.0–12.0)
NEUTROS PCT: 57.8 % (ref 43.0–77.0)
Neutro Abs: 4.1 10*3/uL (ref 1.4–7.7)
Platelets: 253 10*3/uL (ref 150.0–400.0)
RBC: 4.43 Mil/uL (ref 3.87–5.11)
RDW: 14.2 % (ref 11.5–15.5)
WBC: 7 10*3/uL (ref 4.0–10.5)

## 2017-01-02 LAB — PULMONARY FUNCTION TEST
FEF 25-75 PRE: 2.52 L/s
FEF2575-%PRED-PRE: 129 %
FEV1-%PRED-PRE: 112 %
FEV1-PRE: 2.41 L
FEV1FVC-%Pred-Pre: 105 %
FEV6-%Pred-Pre: 108 %
FEV6-Pre: 2.9 L
FEV6FVC-%PRED-PRE: 102 %
FVC-%PRED-PRE: 106 %
FVC-Pre: 2.97 L
Pre FEV1/FVC ratio: 81 %
Pre FEV6/FVC Ratio: 98 %

## 2017-01-02 LAB — POCT EXHALED NITRIC OXIDE: FeNO level (ppb): 12

## 2017-01-02 MED ORDER — FLUTICASONE-SALMETEROL 250-50 MCG/DOSE IN AEPB: INHALATION_SPRAY | RESPIRATORY_TRACT | 3 refills | Status: DC

## 2017-01-02 MED ORDER — AMOXICILLIN-POT CLAVULANATE 875-125 MG PO TABS: 1.0000 | ORAL_TABLET | Freq: Two times a day (BID) | ORAL | 1 refills | Status: DC

## 2017-01-02 MED ORDER — FLUCONAZOLE 150 MG PO TABS: 150.0000 mg | ORAL_TABLET | Freq: Every day | ORAL | 1 refills | Status: DC

## 2017-01-02 NOTE — Assessment & Plan Note (Signed)
She describes pansinusitis changes on CT of [place] She describes a pattern of recurrent purulent sinusitis. During flares she is able to flush mucous plugs with saline rinse. She has seen ENT appropriately. This process seems to trigger lower airway irritability. She now has non-purulent mucus drainage and feels congestion returning. Plan-standby prescription for Augmentin with Diflucan given to hold. Continue to follow with ENT.

## 2017-01-02 NOTE — Patient Instructions (Signed)
Order- lab- CBC w diff, IgE     Dx asthma moderate intermittent  Order FENO  Order- Office spirometry  Scripts printed for augmentin to hold  Script printed for Diflucan to hold    Please call as needed

## 2017-01-02 NOTE — Progress Notes (Signed)
Spirometry done today. 
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
## 2017-01-02 NOTE — Assessment & Plan Note (Signed)
Asthma pattern usually flares in response to upper airway inflammation. We can check for eosinophils and IgE but FENO a strong allergic component. She is stable on Advair with little need for rescue inhaler. Not sure how important distinction between chronic and intermittent is for her. We will let her continue Advair.

## 2017-01-03 LAB — IGE: IGE (IMMUNOGLOBULIN E), SERUM: 19 kU/L (ref <115)

## 2017-01-05 ENCOUNTER — Ambulatory Visit
Admission: RE | Admit: 2017-01-05 | Discharge: 2017-01-05 | Disposition: A | Discharge status: Home / Self Care | Payer: Medicare Other | Source: Ambulatory Visit | Attending: Family Medicine | Admitting: Family Medicine

## 2017-01-05 DIAGNOSIS — Z1231 Encounter for screening mammogram for malignant neoplasm of breast: Secondary | ICD-10-CM

## 2017-01-09 DIAGNOSIS — M25512 Pain in left shoulder: Secondary | ICD-10-CM | POA: Diagnosis not present

## 2017-03-26 DIAGNOSIS — E1165 Type 2 diabetes mellitus with hyperglycemia: Secondary | ICD-10-CM | POA: Diagnosis not present

## 2017-03-26 DIAGNOSIS — F325 Major depressive disorder, single episode, in full remission: Secondary | ICD-10-CM | POA: Diagnosis not present

## 2017-03-26 DIAGNOSIS — Z23 Encounter for immunization: Secondary | ICD-10-CM | POA: Diagnosis not present

## 2017-03-26 DIAGNOSIS — M150 Primary generalized (osteo)arthritis: Secondary | ICD-10-CM | POA: Diagnosis not present

## 2017-03-26 DIAGNOSIS — G4700 Insomnia, unspecified: Secondary | ICD-10-CM | POA: Diagnosis not present

## 2017-03-26 DIAGNOSIS — I10 Essential (primary) hypertension: Secondary | ICD-10-CM | POA: Diagnosis not present

## 2017-03-26 DIAGNOSIS — E782 Mixed hyperlipidemia: Secondary | ICD-10-CM | POA: Diagnosis not present

## 2017-04-17 ENCOUNTER — Ambulatory Visit: Payer: Self-pay | Admitting: Cardiology

## 2017-06-04 ENCOUNTER — Encounter: Payer: Self-pay | Admitting: Cardiology

## 2017-06-04 ENCOUNTER — Ambulatory Visit (INDEPENDENT_AMBULATORY_CARE_PROVIDER_SITE_OTHER): Payer: Medicare Other | Admitting: Cardiology

## 2017-06-04 VITALS — BP 128/78 | HR 86 | Ht 61.5 in | Wt 161.2 lb

## 2017-06-04 DIAGNOSIS — I351 Nonrheumatic aortic (valve) insufficiency: Secondary | ICD-10-CM | POA: Diagnosis not present

## 2017-06-04 DIAGNOSIS — I493 Ventricular premature depolarization: Secondary | ICD-10-CM

## 2017-06-04 DIAGNOSIS — I444 Left anterior fascicular block: Secondary | ICD-10-CM

## 2017-06-04 DIAGNOSIS — I4510 Unspecified right bundle-branch block: Secondary | ICD-10-CM | POA: Diagnosis not present

## 2017-06-04 NOTE — Progress Notes (Signed)
Cardiology Office Note    Date:  06/04/2017   ID:  Debbie Norris, DOB Feb 02, 1951, MRN [id number]  PCP:  Darcus Austin, MD  Cardiologist:   Dr. Marlou Porch    History of Present Illness:  Debbie Norris is a 67 y.o. female last seen by [name] 03/25/15 with history of aortic insufficiency, PVCs here for follow-up.  Dizzy when tilts head back, chronically at times.  Sinus surgeries. No syncope.   PVC's with sudafed in the past. Wore monitor. Stopped hormone and it helped. She is a Marine scientist., retired [employer] scientist.   Diabetes allergic to metformin she states. Thought it may have been simvastatin at time. Tried Livalo--cramps. Atorvastatin, Pravastatin, Quit.   Mother had 58 MI. Mini-gastric bypass, when she had gall bladder removed.   06/04/17  - doing well, no chest pain, syncope, bleeding, orthopnea, PND. Very rare palpitations.   Past Medical History:  Diagnosis Date  . Abdominal pain   . Asthma   . Atrophic vaginitis   . Cancer ([place])    basel cell  . Depression   . Diabetes ([place])   . High blood pressure   . Hx of colonic polyps   . Hyperlipidemia   . Obesity    GASTRIC BYPASS IN 2006  . Vitamin D deficiency     Past Surgical History:  Procedure Laterality Date  . ABDOMINAL HYSTERECTOMY    . BREAST CYST ASPIRATION Right unsure   pt states 30+ years ago  . COLONOSCOPY    . FACIAL COSMETIC SURGERY    . GASTRIC BYPASS    . NASAL SINUS SURGERY      Current Medications: Outpatient Medications Prior to Visit  Medication Sig Dispense Refill  . ALPRAZolam (XANAX) 0.5 MG tablet Take 0.5 mg by mouth daily as needed for anxiety.    . diclofenac sodium (VOLTAREN) 1 % GEL As directed  1  . FLUoxetine (PROZAC) 40 MG capsule Take 40 mg by mouth daily.    . fluticasone (FLONASE) 50 MCG/ACT nasal spray Place 2 sprays into both nostrils daily.    . Fluticasone-Salmeterol (ADVAIR DISKUS) 250-50 MCG/DOSE AEPB USE 1 PUFF THEN RINSE MOUTH, TWICE DAILY 180 each 3  . glipiZIDE  (GLUCOTROL XL) 5 MG 24 hr tablet Take 5 mg by mouth daily.  5  . ipratropium (ATROVENT) 0.06 % nasal spray Place 2 sprays into both nostrils daily.    . montelukast (SINGULAIR) 10 MG tablet TAKE 1 TABLET BY MOUTH DAILY 90 tablet 3  . ONE TOUCH ULTRA TEST test strip 1 each by Other route as needed for other.   3  . pantoprazole (PROTONIX) 40 MG tablet Take 40 mg by mouth 2 (two) times daily at 10 am and 4 pm.     . PROVENTIL HFA 108 (90 BASE) MCG/ACT inhaler TAKE 2 PUFFS BY MOUTH EVERY 4 TO 6 HOURS AS NEEDED 6.7 Inhaler 11  . rosuvastatin (CRESTOR) 5 MG tablet Take 5 mg by mouth daily.  1  . tolterodine (DETROL LA) 4 MG 24 hr capsule Take 1 capsule by mouth daily.     Marland Kitchen VAGIFEM 10 MCG TABS vaginal tablet Place 1 tablet vaginally 2 (two) times a week.   3  . Vitamin D, Ergocalciferol, (DRISDOL) 50000 UNITS CAPS capsule Take 50,000 Units by mouth daily. Sundays    . zolpidem (AMBIEN) 10 MG tablet Take 5 mg by mouth at bedtime as needed for sleep.     Marland Kitchen amoxicillin-clavulanate (AUGMENTIN) 875-125 MG tablet Take 1  tablet by mouth 2 (two) times daily. (Patient not taking: Reported on 06/04/2017) 20 tablet 1  . cyclobenzaprine (FLEXERIL) 10 MG tablet Take 1 tablet by mouth 3 (three) times daily as needed.    . fluconazole (DIFLUCAN) 150 MG tablet Take 1 tablet (150 mg total) by mouth daily. (Patient not taking: Reported on 06/04/2017) 10 tablet 1   No facility-administered medications prior to visit.      Allergies:   Toradol [ketorolac tromethamine]; Bextra [valdecoxib]; Lipitor [atorvastatin]; Metformin and related; Other; Phenergan [promethazine hcl]; Pineapple; and Prinivil [lisinopril]   Social History   Socioeconomic History  . Marital status: Married    Spouse name: None  . Number of children: None  . Years of education: None  . Highest education level: None  Social Needs  . Financial resource strain: None  . Food insecurity - worry: None  . Food insecurity - inability: None  .  Transportation needs - medical: None  . Transportation needs - non-medical: None  Occupational History  . None  Tobacco Use  . Smoking status: Never Smoker  . Smokeless tobacco: Never Used  Substance and Sexual Activity  . Alcohol use: Yes    Comment: social  . Drug use: No  . Sexual activity: None  Other Topics Concern  . None  Social History Narrative  . None     Family History:  The patient's family history includes Cancer in her father; Emphysema in her paternal aunt; Heart disease in her father and mother.   ROS:   Please see the history of present illness.    ROS All other systems reviewed and are negative.   PHYSICAL EXAM:   VS:  BP 128/78   Pulse 86   Ht 5' 1.5" (1.562 m)   Wt 161 lb 3.2 oz (73.1 kg)   SpO2 98%   BMI 29.97 kg/m    GEN: Well nourished, well developed, in no acute distress  HEENT: normal  Neck: no JVD, carotid bruits, or masses Cardiac: RRR; no murmurs, rubs, or gallops,no edema  Respiratory:  clear to auscultation bilaterally, normal work of breathing GI: soft, nontender, nondistended, + BS MS: no deformity or atrophy  Skin: warm and dry, no rash Neuro:  Alert and Oriented x 3, Strength and sensation are intact Psych: euthymic mood, full affect    Wt Readings from Last 3 Encounters:  06/04/17 161 lb 3.2 oz (73.1 kg)  01/02/17 159 lb 9.6 oz (72.4 kg)  03/16/16 158 lb 3.2 oz (71.8 kg)      Studies/Labs Reviewed:   EKG:  EKG is ordered today.  The ekg ordered today demonstrates 06/04/17-normal sinus rhythm, right bundle branch block, left anterior fascicular block personally viewed-prior 03/16/16-sinus rhythm, right bundle branch block, left anterior fascicular block, bifascicular block no other significant abnormalities personally reviewed.  Recent Labs: 01/02/2017: Hemoglobin 12.5; Platelets 253.0   Lipid Panel No results found for: CHOL, TRIG, HDL, CHOLHDL, VLDL, LDLCALC, LDLDIRECT  Additional studies/ records that were reviewed  today include:   ECHO:01/05/16:  - Left ventricle: The cavity size was normal.Septal wall thickness   was increased in a pattern of moderate LVH. Systolic function was   vigorous. The estimated ejection fraction was in the range of 65%   to 70%. Wall motion was normal; there were no regional wall   motion abnormalities. Doppler parameters are consistent with   abnormal left ventricular relaxation (grade 1 diastolic   dysfunction). Doppler parameters are consistent with   indeterminate ventricular filling pressure. -  Aortic valve: Transvalvular velocity was within the normal range.   There was no stenosis. There was mild regurgitation. - Mitral valve: Transvalvular velocity was within the normal range.   There was no evidence for stenosis. There was mild regurgitation. - Left atrium: The atrium was mildly dilated. - Right ventricle: The cavity size was normal. Wall thickness was   normal. Systolic function was normal. - Atrial septum: No defect or patent foramen ovale was identified. - Tricuspid valve: There was mild regurgitation. - Pulmonary arteries: Systolic pressure was within the normal   range. PA peak pressure: 34 mm Hg (S).    ASSESSMENT:    1. PVC (premature ventricular contraction)   2. Nonrheumatic aortic valve insufficiency   3. Right bundle branch block   4. Left anterior fascicular block      PLAN:  In order of problems listed above:  Mild aortic regurgitation   - Previously described as moderate on prior echocardiogram. Most recently mild. No clinical change in symptoms. We will monitor clinically. I do not think that she needs an echocardiogram at this time.   PVCs  - No PVCs noted on EKG today. Excellent. The seemed to be exacerbated by Sudafed/caffeine. Try to avoid. Doing well.  Right bundle branch block/left anterior fascicular block/bifascicular block  - Overall doing well. No syncope. Explained to her that if her electrical system deteriorates  further, she may need pacemaker in the future. No syncope.   Prior statin intolerance  - She has been off of statin now for several months. Crestor 5 now. Off Zetia (diarrhea)    Medication Adjustments/Labs and Tests Ordered: Current medicines are reviewed at length with the patient today.  Concerns regarding medicines are outlined above.  Medication changes, Labs and Tests ordered today are listed in the Patient Instructions below. Patient Instructions  Medication Instructions:  The current medical regimen is effective;  continue present plan and medications.  Follow-Up: Follow up in 1 year with Dr. Marlou Porch.  You will receive a letter in the mail 2 months before you are due.  Please call us when you receive this letter to schedule your follow up appointment.  If you need a refill on your cardiac medications before your next appointment, please call your pharmacy.  Thank you for choosing [facility]!!        Signed, Candee Furbish, MD  06/04/2017 3:50 PM    [address], [place], [place]  [address] Phone: [phone]; Fax: [phone]

## 2017-06-04 NOTE — Patient Instructions (Signed)

## 2017-06-05 DIAGNOSIS — E7800 Pure hypercholesterolemia, unspecified: Secondary | ICD-10-CM | POA: Diagnosis not present

## 2017-06-05 DIAGNOSIS — Z79899 Other long term (current) drug therapy: Secondary | ICD-10-CM | POA: Diagnosis not present

## 2017-06-29 DIAGNOSIS — N390 Urinary tract infection, site not specified: Secondary | ICD-10-CM | POA: Diagnosis not present

## 2017-07-20 DIAGNOSIS — M65331 Trigger finger, right middle finger: Secondary | ICD-10-CM | POA: Diagnosis not present

## 2017-07-20 DIAGNOSIS — I7300 Raynaud's syndrome without gangrene: Secondary | ICD-10-CM | POA: Diagnosis not present

## 2017-08-20 ENCOUNTER — Telehealth: Payer: Self-pay | Admitting: Internal Medicine

## 2017-08-20 MED ORDER — AMOXICILLIN-POT CLAVULANATE 875-125 MG PO TABS: 1.0000 | ORAL_TABLET | Freq: Two times a day (BID) | ORAL | 0 refills | Status: DC

## 2017-08-20 NOTE — Telephone Encounter (Signed)
Here with husband. Asks refill augmentin to carry for trip to [place].

## 2017-10-04 DIAGNOSIS — R319 Hematuria, unspecified: Secondary | ICD-10-CM | POA: Diagnosis not present

## 2017-10-04 DIAGNOSIS — E782 Mixed hyperlipidemia: Secondary | ICD-10-CM | POA: Diagnosis not present

## 2017-10-04 DIAGNOSIS — I10 Essential (primary) hypertension: Secondary | ICD-10-CM | POA: Diagnosis not present

## 2017-10-04 DIAGNOSIS — E1169 Type 2 diabetes mellitus with other specified complication: Secondary | ICD-10-CM | POA: Diagnosis not present

## 2017-10-04 DIAGNOSIS — G720 Drug-induced myopathy: Secondary | ICD-10-CM | POA: Diagnosis not present

## 2017-10-04 DIAGNOSIS — M545 Low back pain: Secondary | ICD-10-CM | POA: Diagnosis not present

## 2017-10-04 DIAGNOSIS — Z23 Encounter for immunization: Secondary | ICD-10-CM | POA: Diagnosis not present

## 2017-10-04 DIAGNOSIS — I351 Nonrheumatic aortic (valve) insufficiency: Secondary | ICD-10-CM | POA: Diagnosis not present

## 2017-10-04 DIAGNOSIS — Z6830 Body mass index (BMI) 30.0-30.9, adult: Secondary | ICD-10-CM | POA: Diagnosis not present

## 2017-10-04 DIAGNOSIS — F330 Major depressive disorder, recurrent, mild: Secondary | ICD-10-CM | POA: Diagnosis not present

## 2017-10-04 DIAGNOSIS — E1165 Type 2 diabetes mellitus with hyperglycemia: Secondary | ICD-10-CM | POA: Diagnosis not present

## 2017-10-04 DIAGNOSIS — Z0000 Encounter for general adult medical examination without abnormal findings: Secondary | ICD-10-CM | POA: Diagnosis not present

## 2017-10-05 DIAGNOSIS — M65331 Trigger finger, right middle finger: Secondary | ICD-10-CM | POA: Diagnosis not present

## 2017-11-06 IMAGING — MG 2D DIGITAL SCREENING BILATERAL MAMMOGRAM WITH CAD AND ADJUNCT TO
8 of 12 series · 8 of 28 positions shown · non-contrast
Comparison: Previous exam(s).

CLINICAL DATA: Screening.

EXAM:
2D DIGITAL SCREENING BILATERAL MAMMOGRAM WITH CAD AND ADJUNCT TOMO

[R MLO]
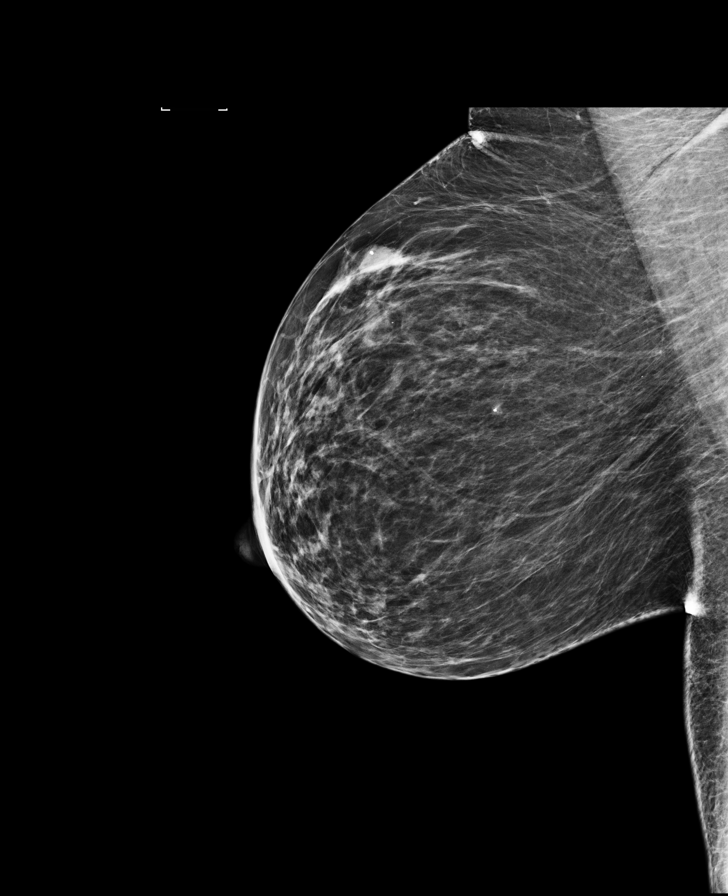

[R CC]
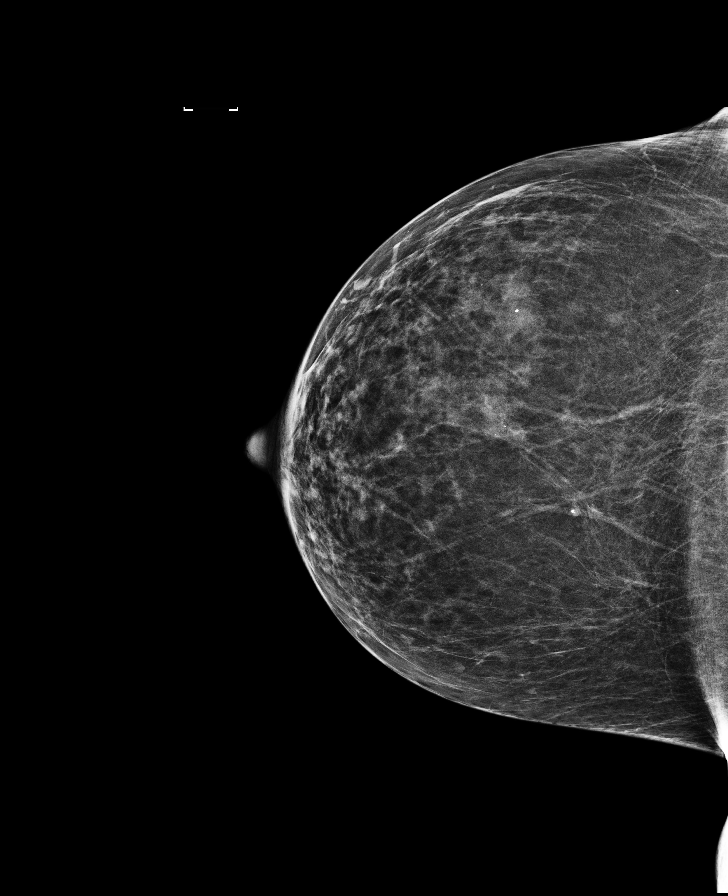

[L MLO synth-2D]
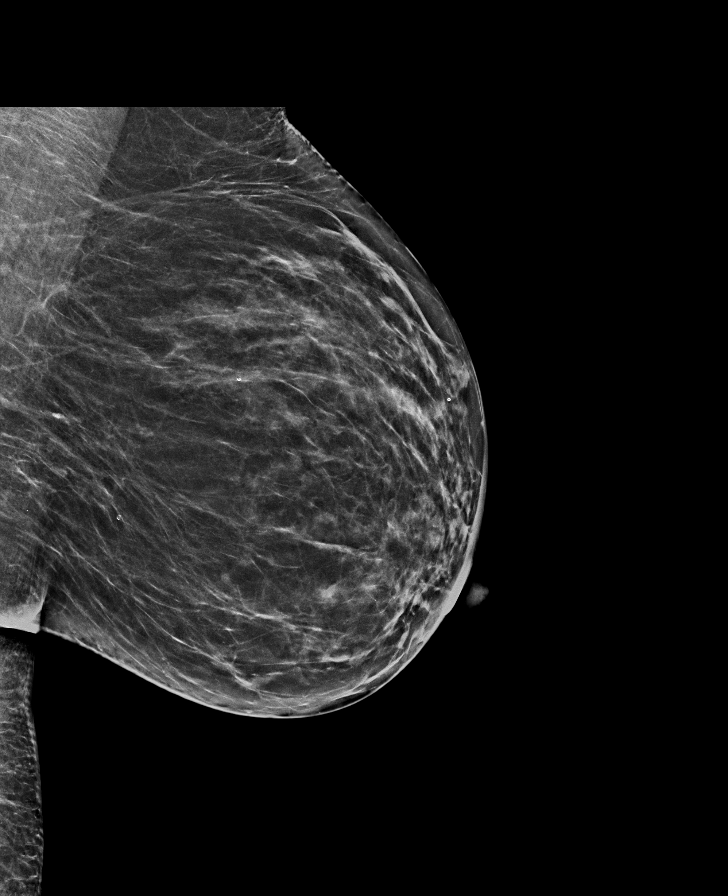

[L CC]
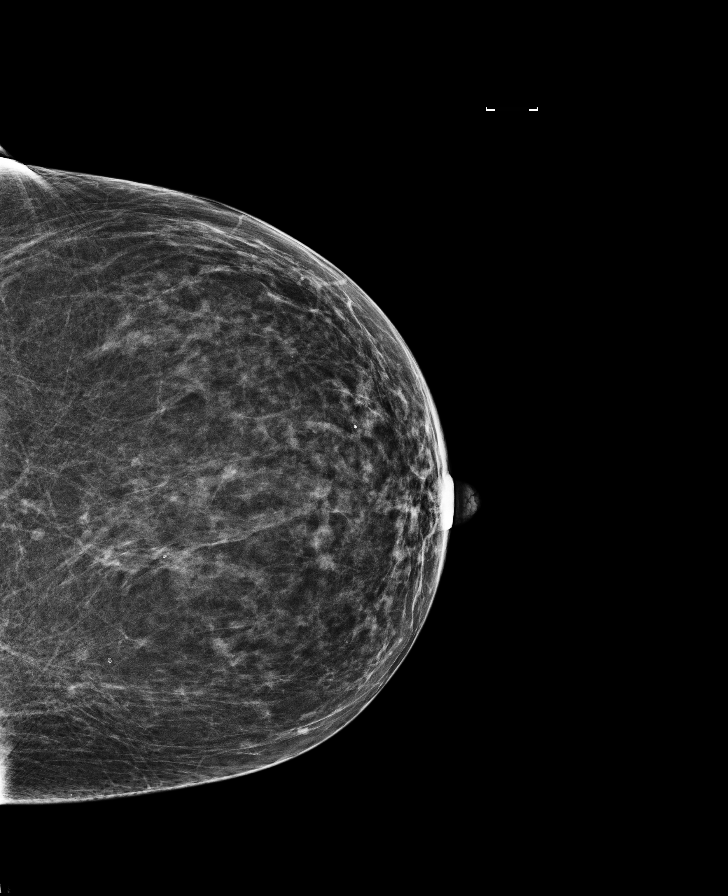

[L CC synth-2D]
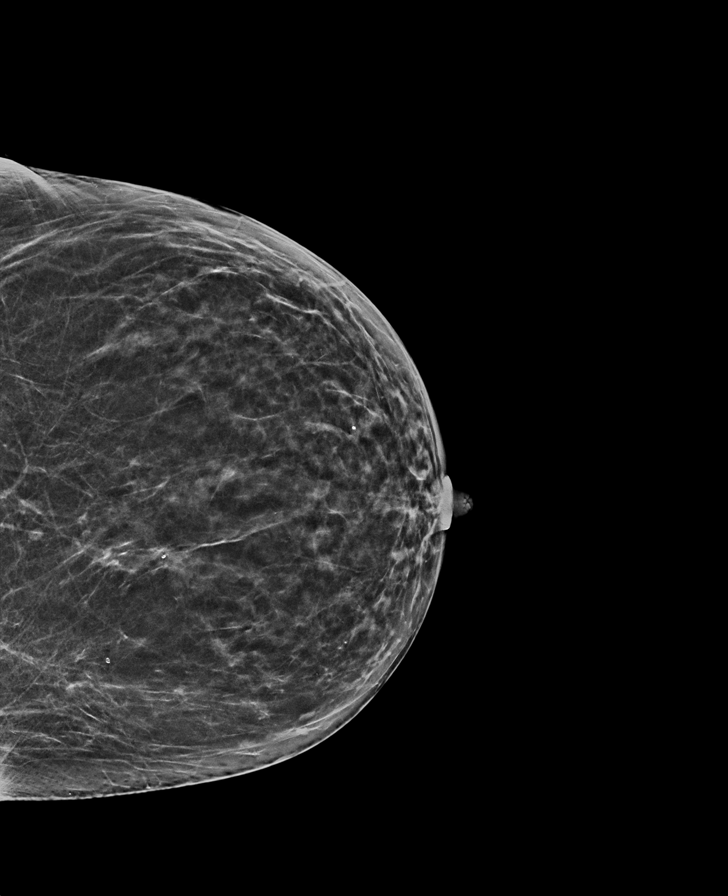

[R CC synth-2D]
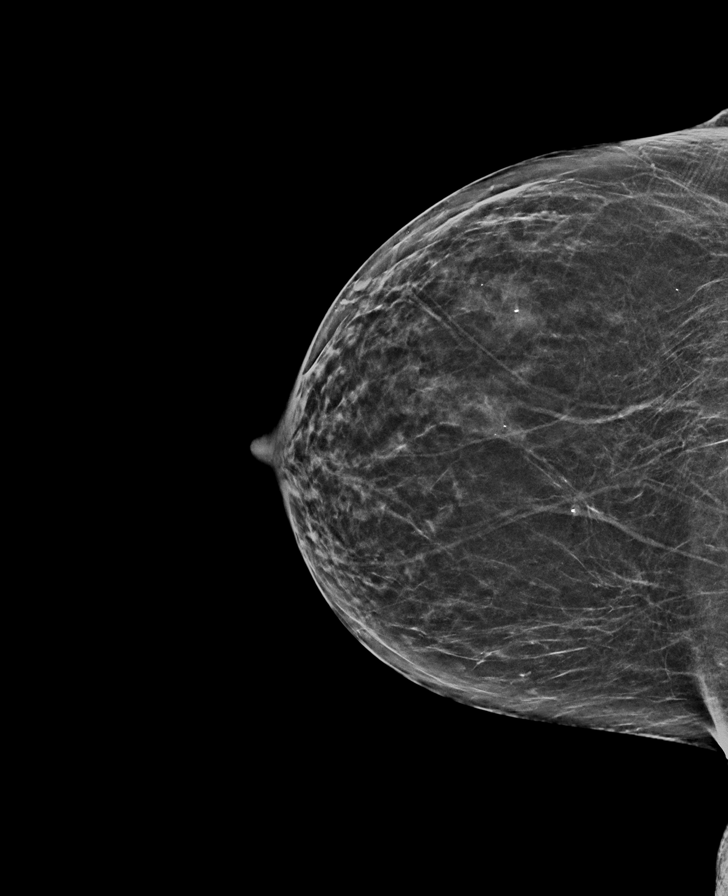

[L MLO]
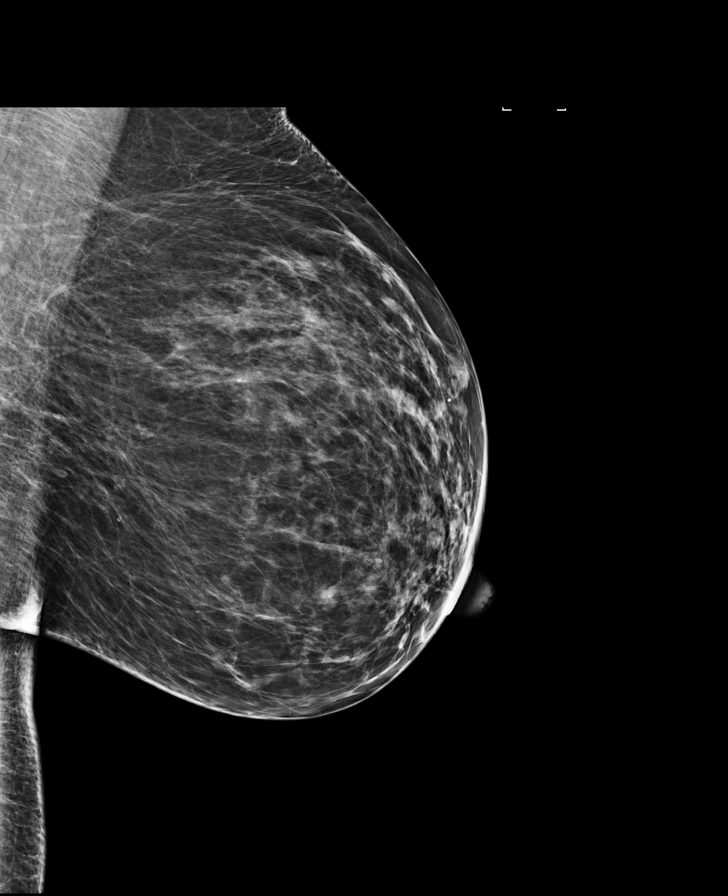

[R MLO synth-2D]
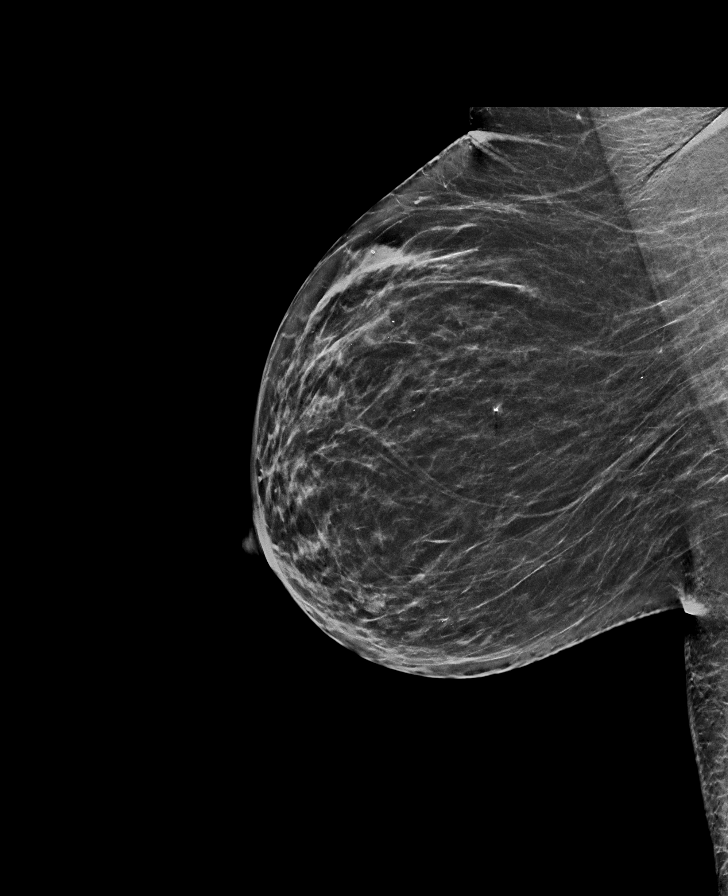

[8 of 28 positions shown; findings below may reference images not displayed]

ACR Breast Density Category b: There are scattered areas of
fibroglandular density.
FINDINGS: There are no findings suspicious for malignancy. Images were
processed with CAD.
IMPRESSION: No mammographic evidence of malignancy. A result letter of this
screening mammogram will be mailed directly to the patient.

RECOMMENDATION:
Screening mammogram in one year. (Code:97-6-RS4)

BI-RADS CATEGORY  1: Negative.

## 2017-11-29 DIAGNOSIS — J0100 Acute maxillary sinusitis, unspecified: Secondary | ICD-10-CM | POA: Diagnosis not present

## 2017-11-29 DIAGNOSIS — R1011 Right upper quadrant pain: Secondary | ICD-10-CM | POA: Diagnosis not present

## 2017-11-30 DIAGNOSIS — R1011 Right upper quadrant pain: Secondary | ICD-10-CM | POA: Diagnosis not present

## 2017-12-03 DIAGNOSIS — Z9884 Bariatric surgery status: Secondary | ICD-10-CM | POA: Diagnosis not present

## 2017-12-03 DIAGNOSIS — R110 Nausea: Secondary | ICD-10-CM | POA: Diagnosis not present

## 2017-12-03 DIAGNOSIS — R1013 Epigastric pain: Secondary | ICD-10-CM | POA: Diagnosis not present

## 2017-12-05 DIAGNOSIS — R112 Nausea with vomiting, unspecified: Secondary | ICD-10-CM | POA: Diagnosis not present

## 2017-12-05 DIAGNOSIS — R109 Unspecified abdominal pain: Secondary | ICD-10-CM | POA: Diagnosis not present

## 2017-12-05 DIAGNOSIS — Z9884 Bariatric surgery status: Secondary | ICD-10-CM | POA: Diagnosis not present

## 2017-12-05 DIAGNOSIS — K449 Diaphragmatic hernia without obstruction or gangrene: Secondary | ICD-10-CM | POA: Diagnosis not present

## 2017-12-05 DIAGNOSIS — R197 Diarrhea, unspecified: Secondary | ICD-10-CM | POA: Diagnosis not present

## 2017-12-05 DIAGNOSIS — K6389 Other specified diseases of intestine: Secondary | ICD-10-CM | POA: Diagnosis not present

## 2017-12-05 DIAGNOSIS — R1110 Vomiting, unspecified: Secondary | ICD-10-CM | POA: Diagnosis not present

## 2017-12-05 DIAGNOSIS — R1013 Epigastric pain: Secondary | ICD-10-CM | POA: Diagnosis not present

## 2017-12-19 DIAGNOSIS — R933 Abnormal findings on diagnostic imaging of other parts of digestive tract: Secondary | ICD-10-CM | POA: Diagnosis not present

## 2017-12-19 DIAGNOSIS — F329 Major depressive disorder, single episode, unspecified: Secondary | ICD-10-CM | POA: Diagnosis not present

## 2017-12-19 DIAGNOSIS — E119 Type 2 diabetes mellitus without complications: Secondary | ICD-10-CM | POA: Diagnosis not present

## 2017-12-19 DIAGNOSIS — M1990 Unspecified osteoarthritis, unspecified site: Secondary | ICD-10-CM | POA: Diagnosis not present

## 2017-12-19 DIAGNOSIS — R1013 Epigastric pain: Secondary | ICD-10-CM | POA: Diagnosis not present

## 2017-12-19 DIAGNOSIS — F419 Anxiety disorder, unspecified: Secondary | ICD-10-CM | POA: Diagnosis not present

## 2017-12-19 DIAGNOSIS — J45909 Unspecified asthma, uncomplicated: Secondary | ICD-10-CM | POA: Diagnosis not present

## 2017-12-19 DIAGNOSIS — R112 Nausea with vomiting, unspecified: Secondary | ICD-10-CM | POA: Diagnosis not present

## 2017-12-19 DIAGNOSIS — R110 Nausea: Secondary | ICD-10-CM | POA: Diagnosis not present

## 2017-12-19 DIAGNOSIS — K2960 Other gastritis without bleeding: Secondary | ICD-10-CM | POA: Diagnosis not present

## 2017-12-19 DIAGNOSIS — E785 Hyperlipidemia, unspecified: Secondary | ICD-10-CM | POA: Diagnosis not present

## 2017-12-19 DIAGNOSIS — K219 Gastro-esophageal reflux disease without esophagitis: Secondary | ICD-10-CM | POA: Diagnosis not present

## 2017-12-19 DIAGNOSIS — K208 Other esophagitis: Secondary | ICD-10-CM | POA: Diagnosis not present

## 2017-12-19 DIAGNOSIS — R935 Abnormal findings on diagnostic imaging of other abdominal regions, including retroperitoneum: Secondary | ICD-10-CM | POA: Diagnosis not present

## 2017-12-19 DIAGNOSIS — G4700 Insomnia, unspecified: Secondary | ICD-10-CM | POA: Diagnosis not present

## 2017-12-19 DIAGNOSIS — Z9884 Bariatric surgery status: Secondary | ICD-10-CM | POA: Diagnosis not present

## 2017-12-21 DIAGNOSIS — S01112A Laceration without foreign body of left eyelid and periocular area, initial encounter: Secondary | ICD-10-CM | POA: Diagnosis not present

## 2017-12-27 DIAGNOSIS — D649 Anemia, unspecified: Secondary | ICD-10-CM | POA: Diagnosis not present

## 2018-01-02 ENCOUNTER — Ambulatory Visit: Payer: Self-pay | Admitting: Internal Medicine

## 2018-02-06 ENCOUNTER — Ambulatory Visit (INDEPENDENT_AMBULATORY_CARE_PROVIDER_SITE_OTHER): Payer: Medicare Other | Admitting: Internal Medicine

## 2018-02-06 ENCOUNTER — Encounter: Payer: Self-pay | Admitting: Internal Medicine

## 2018-02-06 DIAGNOSIS — J4489 Other specified chronic obstructive pulmonary disease: Secondary | ICD-10-CM

## 2018-02-06 DIAGNOSIS — J449 Chronic obstructive pulmonary disease, unspecified: Secondary | ICD-10-CM | POA: Diagnosis not present

## 2018-02-06 DIAGNOSIS — J4540 Moderate persistent asthma, uncomplicated: Secondary | ICD-10-CM | POA: Diagnosis not present

## 2018-02-06 DIAGNOSIS — J302 Other seasonal allergic rhinitis: Secondary | ICD-10-CM | POA: Diagnosis not present

## 2018-02-06 DIAGNOSIS — Z23 Encounter for immunization: Secondary | ICD-10-CM | POA: Diagnosis not present

## 2018-02-06 DIAGNOSIS — J0101 Acute recurrent maxillary sinusitis: Secondary | ICD-10-CM | POA: Diagnosis not present

## 2018-02-06 DIAGNOSIS — J3089 Other allergic rhinitis: Secondary | ICD-10-CM

## 2018-02-06 DIAGNOSIS — E119 Type 2 diabetes mellitus without complications: Secondary | ICD-10-CM | POA: Diagnosis not present

## 2018-02-06 MED ORDER — MONTELUKAST SODIUM 10 MG PO TABS: 10.0000 mg | ORAL_TABLET | Freq: Every day | ORAL | 3 refills | Status: DC

## 2018-02-06 MED ORDER — FLUTICASONE-SALMETEROL 250-50 MCG/DOSE IN AEPB: INHALATION_SPRAY | RESPIRATORY_TRACT | 12 refills | Status: DC

## 2018-02-06 MED ORDER — ALBUTEROL SULFATE HFA 108 (90 BASE) MCG/ACT IN AERS: INHALATION_SPRAY | RESPIRATORY_TRACT | 11 refills | Status: DC

## 2018-02-06 MED ORDER — AMOXICILLIN-POT CLAVULANATE 875-125 MG PO TABS
1.0000 | ORAL_TABLET | Freq: Two times a day (BID) | ORAL | 0 refills | Status: DC
Start: 2018-02-06 — End: 2018-04-01

## 2018-02-06 NOTE — Progress Notes (Signed)
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 Allergy Profile-09/10/2013-negative-total IgE 12.5 with no specific elevations PFT 11/25/13-within normal limits with insignificant response to bronchodilator Triggers for asthma include strong odors, seasonal pollens, sinus infections, and the room at work which she goes into intermittently- experiencing hoarseness and cough. FENO 01/02/17- 12 ( not elevated) Office Spirometry 8/128/18- WNL-FVC 2.79/106%, FEV1 2.13/112%, ratio 0.77, FEF 25-75% 1.95/129% -----------------------------------------------------------------------------------  01/02/17-- 67 year old female never smoker followed for Asthma, allergic rhinitis, recurrent acute maxillary sinusitis complicated by GERD, aortic regurgitation, DM2 FOLLOWS FOR: Pt states she is doing well overall; had a rough Spring-kept getting bronchitis with productive cough-yellow to green in color.  Continues seasonal spring and fall exacerbations. This past year has been difficult. [place] off a golf cart in October striking head. CT of head showed chronic sinusitis. Shoulder surgery in January. One week later had bronchitis and eventually had 3 episodes of bronchitis. In spring was using saline sinus rinse, washing out large green plugs. Saw Dr. Johnnette Gourd who did rhinoscopy and told her she seemed "fine at this time". Later dentist did Panorex because of tooth pain, diagnosed sinusitis and treated her with Avelox. Shingles vaccine in May associated with several days of malaise. In June she went to an urgent care for sinusitis and bronchitis and responded well to prednisone Dosepak and 10 days Augmentin. Subsequently felt well but now in the last few days has begun getting sense of early head pressure again. She has had little routine asthma, maintained on Advair 250/50. No  nighttime asthma and little use of rescue inhaler. FENO 01/02/17- 12 ( not elevated) Office Spirometry 8/128/18- WNL-FVC 2.79/106%, FEV1 2.13/112%, ratio 0.77, FEF 25-75% 1.95/129%  02/06/2018- 67 year old female never smoker followed for Asthma, allergic rhinitis, recurrent acute maxillary sinusitis complicated by GERD, aortic regurgitation, DM2 -----Asthma; Cough-productive-white in color; PND as well.  Proventil HFA, Singulair, Advair 250, Flonase, Most of her respiratory problems began as sinus infections.  She kept Augmentin from last time it was prescribed and took it for an active sinusitis while in [place].  Nose has remained somewhat stuffy through September.  Does saline sinus rinse.  Notes mild postnasal drip and chest congestion with cough productive scant white sputum, no fever or sore throat. Using Flonase twice daily. IgE was only 19.  ROS-see HPI    + = positive Constitutional:   No-   weight loss, night sweats, fevers, chills, fatigue, lassitude. HEENT:   No-  headaches, +difficulty swallowing, tooth/dental problems, sore throat,       Sneezing, itching, +ear ache, +nasal congestion, post nasal drip,  CV:  No-   chest pain, orthopnea, PND, swelling in lower extremities, anasarca,                                                                         dizziness, palpitations Resp: no-shortness of breath with exertion or at rest.              productive cough, non-productive cough,  No- coughing up of blood.              No-   change in color  of mucus.  No- wheezing.   Skin: No-   rash or lesions. GI:  No-heartburn, indigestion, abdominal pain, nausea, vomiting, GU:  MS:  +  joint pain or swelling.  No- decreased range of motion.  No- back pain. Neuro-     nothing unusual Psych:  No- change in mood or affect. + depression or anxiety.  No memory loss.  OBJ- Physical Exam General- Alert, Oriented, Affect-appropriate, Distress- none acute Skin- rash-none, lesions- none,  excoriation- none Lymphadenopathy- none Head- atraumatic            Eyes- Gross vision intact, PERRLA, conjunctivae and secretions clear            Ears- Hearing, canals-normal            Nose- mucosa normal, no-Septal dev, +, no-polyps, erosion, perforation             Throat- Mallampati II , mucosa clear, drainage- none, tonsils- atrophic Neck- flexible , trachea midline, no stridor , thyroid nl, carotid no bruit Chest - symmetrical excursion , unlabored           Heart/CV- RRR ,  Murmur +1/6 syst A.I. , no gallop  , no rub, nl s1 s2                           - JVD- none , edema- none, stasis changes- none, varices- none           Lung- clear to P&A, wheeze- none, cough+slight , dullness-none, rub- none           Chest wall-  Abd-  Br/ Gen/ Rectal- Not done, not indicated Extrem- cyanosis- none, clubbing, none, atrophy- none, strength- nl Neuro- grossly intact to observation

## 2018-02-06 NOTE — Patient Instructions (Signed)
Meds refilled  Order- flu vax- senior   Please call if we can help

## 2018-02-07 ENCOUNTER — Other Ambulatory Visit: Payer: Self-pay | Admitting: Family Medicine

## 2018-02-07 ENCOUNTER — Ambulatory Visit
Admission: RE | Admit: 2018-02-07 | Discharge: 2018-02-07 | Disposition: A | Discharge status: Home / Self Care | Payer: Medicare Other | Source: Ambulatory Visit | Attending: Family Medicine | Admitting: Family Medicine

## 2018-02-07 DIAGNOSIS — Z86010 Personal history of colonic polyps: Secondary | ICD-10-CM | POA: Diagnosis not present

## 2018-02-07 DIAGNOSIS — D508 Other iron deficiency anemias: Secondary | ICD-10-CM | POA: Diagnosis not present

## 2018-02-07 DIAGNOSIS — Z1231 Encounter for screening mammogram for malignant neoplasm of breast: Secondary | ICD-10-CM | POA: Diagnosis not present

## 2018-02-07 DIAGNOSIS — R1011 Right upper quadrant pain: Secondary | ICD-10-CM | POA: Diagnosis not present

## 2018-02-07 DIAGNOSIS — K219 Gastro-esophageal reflux disease without esophagitis: Secondary | ICD-10-CM | POA: Diagnosis not present

## 2018-02-07 NOTE — Assessment & Plan Note (Signed)
Exacerbations are driven primarily by acute sinus infections.  She is currently clear. Plan-continue prevention measures addressing particularly her sinus disease.

## 2018-02-07 NOTE — Assessment & Plan Note (Signed)
Currently in remission.  We discussed continuing maintenance protective measures including nasal sprays and saline rinse.  After discussion, agreed to let her hold prescription for Augmentin in case needed.

## 2018-02-11 ENCOUNTER — Other Ambulatory Visit: Payer: Self-pay | Admitting: Gastroenterology

## 2018-02-11 DIAGNOSIS — D508 Other iron deficiency anemias: Secondary | ICD-10-CM

## 2018-02-11 DIAGNOSIS — R112 Nausea with vomiting, unspecified: Secondary | ICD-10-CM

## 2018-02-13 DIAGNOSIS — D508 Other iron deficiency anemias: Secondary | ICD-10-CM | POA: Diagnosis not present

## 2018-03-05 ENCOUNTER — Ambulatory Visit
Admission: RE | Admit: 2018-03-05 | Discharge: 2018-03-05 | Disposition: A | Discharge status: Home / Self Care | Payer: Medicare Other | Source: Ambulatory Visit | Attending: Gastroenterology | Admitting: Gastroenterology

## 2018-03-05 DIAGNOSIS — R112 Nausea with vomiting, unspecified: Secondary | ICD-10-CM

## 2018-03-05 DIAGNOSIS — D508 Other iron deficiency anemias: Secondary | ICD-10-CM | POA: Diagnosis not present

## 2018-04-01 ENCOUNTER — Telehealth: Payer: Self-pay | Admitting: Internal Medicine

## 2018-04-01 MED ORDER — AMOXICILLIN-POT CLAVULANATE 875-125 MG PO TABS: 1.0000 | ORAL_TABLET | Freq: Two times a day (BID) | ORAL | 1 refills | Status: DC

## 2018-04-01 NOTE — Telephone Encounter (Signed)
augmentin 875 mg, # 14, 1 twice daily, ref x 1

## 2018-04-01 NOTE — Telephone Encounter (Signed)
Spoke with patient, medication has been sent. Verbalized understanding. Nothing further needed.

## 2018-04-01 NOTE — Telephone Encounter (Signed)
Called and spoke with patient, she was seen by CY back in October for sinus problems. She was given a prescription of augmentin and was told she would have 4 refills. She is going out of town and wants to take a refill with her but she has none. She is requesting that this be sent in.    CY please advise, thank you.    Current Outpatient Medications on File Prior to Visit  Medication Sig Dispense Refill  . albuterol (PROVENTIL HFA) 108 (90 Base) MCG/ACT inhaler TAKE 2 PUFFS BY MOUTH EVERY 4 TO 6 HOURS AS NEEDED 6.7 Inhaler 11  . ALPRAZolam (XANAX) 0.5 MG tablet Take 0.5 mg by mouth daily as needed for anxiety.    Marland Kitchen amoxicillin-clavulanate (AUGMENTIN) 875-125 MG tablet Take 1 tablet by mouth 2 (two) times daily. 14 tablet 0  . diclofenac sodium (VOLTAREN) 1 % GEL As directed  1  . FLUoxetine (PROZAC) 40 MG capsule Take 40 mg by mouth daily.    . fluticasone (FLONASE) 50 MCG/ACT nasal spray Place 2 sprays into both nostrils daily.    . Fluticasone-Salmeterol (ADVAIR DISKUS) 250-50 MCG/DOSE AEPB USE 1 PUFF THEN RINSE MOUTH, TWICE DAILY 60 each 12  . glipiZIDE (GLUCOTROL XL) 5 MG 24 hr tablet Take 5 mg by mouth daily.  5  . montelukast (SINGULAIR) 10 MG tablet Take 1 tablet (10 mg total) by mouth daily. 90 tablet 3  . ONE TOUCH ULTRA TEST test strip 1 each by Other route as needed for other.   3  . pantoprazole (PROTONIX) 40 MG tablet Take 40 mg by mouth 2 (two) times daily at 10 am and 4 pm.     . rosuvastatin (CRESTOR) 5 MG tablet Take 5 mg by mouth. Twice a week(Sundays and Wednesdays)  1  . tolterodine (DETROL LA) 4 MG 24 hr capsule Take 1 capsule by mouth daily.     Marland Kitchen VAGIFEM 10 MCG TABS vaginal tablet Place 1 tablet vaginally 2 (two) times a week.   3  . Vitamin D, Ergocalciferol, (DRISDOL) 50000 UNITS CAPS capsule Take 50,000 Units by mouth daily. Sundays    . zolpidem (AMBIEN) 10 MG tablet Take 5 mg by mouth at bedtime as needed for sleep.      No current facility-administered  medications on file prior to visit.    Allergies  Allergen Reactions  . Toradol [Ketorolac Tromethamine]     swelling  . Bextra [Valdecoxib] Swelling  . Lipitor [Atorvastatin]     Muscle pain, high level CPK  . Metformin And Related     Muscle pain/cramps  . Other     All vaginal / genital creams: causes severe rash with itching  . Phenergan [Promethazine Hcl]     Itchy   . Pineapple Hives    Stomach cramps  . Prinivil [Lisinopril] Cough

## 2018-04-18 DIAGNOSIS — Z79899 Other long term (current) drug therapy: Secondary | ICD-10-CM | POA: Diagnosis not present

## 2018-04-18 DIAGNOSIS — I351 Nonrheumatic aortic (valve) insufficiency: Secondary | ICD-10-CM | POA: Diagnosis not present

## 2018-04-18 DIAGNOSIS — M62838 Other muscle spasm: Secondary | ICD-10-CM | POA: Diagnosis not present

## 2018-04-18 DIAGNOSIS — F330 Major depressive disorder, recurrent, mild: Secondary | ICD-10-CM | POA: Diagnosis not present

## 2018-04-18 DIAGNOSIS — L989 Disorder of the skin and subcutaneous tissue, unspecified: Secondary | ICD-10-CM | POA: Diagnosis not present

## 2018-04-18 DIAGNOSIS — Z9884 Bariatric surgery status: Secondary | ICD-10-CM | POA: Diagnosis not present

## 2018-04-18 DIAGNOSIS — I7300 Raynaud's syndrome without gangrene: Secondary | ICD-10-CM | POA: Diagnosis not present

## 2018-04-18 DIAGNOSIS — E1169 Type 2 diabetes mellitus with other specified complication: Secondary | ICD-10-CM | POA: Diagnosis not present

## 2018-04-18 DIAGNOSIS — Z980 Intestinal bypass and anastomosis status: Secondary | ICD-10-CM | POA: Diagnosis not present

## 2018-04-18 DIAGNOSIS — E782 Mixed hyperlipidemia: Secondary | ICD-10-CM | POA: Diagnosis not present

## 2018-04-18 DIAGNOSIS — E559 Vitamin D deficiency, unspecified: Secondary | ICD-10-CM | POA: Diagnosis not present

## 2018-04-18 DIAGNOSIS — I10 Essential (primary) hypertension: Secondary | ICD-10-CM | POA: Diagnosis not present

## 2018-04-24 DIAGNOSIS — S63502A Unspecified sprain of left wrist, initial encounter: Secondary | ICD-10-CM | POA: Diagnosis not present

## 2018-04-24 DIAGNOSIS — M79642 Pain in left hand: Secondary | ICD-10-CM | POA: Diagnosis not present

## 2018-05-06 DIAGNOSIS — S63502A Unspecified sprain of left wrist, initial encounter: Secondary | ICD-10-CM | POA: Diagnosis not present

## 2018-05-06 DIAGNOSIS — M25512 Pain in left shoulder: Secondary | ICD-10-CM | POA: Diagnosis not present

## 2018-05-21 DIAGNOSIS — S63502A Unspecified sprain of left wrist, initial encounter: Secondary | ICD-10-CM | POA: Diagnosis not present

## 2018-05-21 DIAGNOSIS — S52552D Other extraarticular fracture of lower end of left radius, subsequent encounter for closed fracture with routine healing: Secondary | ICD-10-CM | POA: Diagnosis not present

## 2018-05-30 ENCOUNTER — Encounter: Payer: Self-pay | Admitting: Cardiology

## 2018-05-31 DIAGNOSIS — R399 Unspecified symptoms and signs involving the genitourinary system: Secondary | ICD-10-CM | POA: Diagnosis not present

## 2018-05-31 DIAGNOSIS — R944 Abnormal results of kidney function studies: Secondary | ICD-10-CM | POA: Diagnosis not present

## 2018-06-03 DIAGNOSIS — K219 Gastro-esophageal reflux disease without esophagitis: Secondary | ICD-10-CM | POA: Diagnosis not present

## 2018-06-03 DIAGNOSIS — D508 Other iron deficiency anemias: Secondary | ICD-10-CM | POA: Diagnosis not present

## 2018-06-03 DIAGNOSIS — R1010 Upper abdominal pain, unspecified: Secondary | ICD-10-CM | POA: Diagnosis not present

## 2018-06-05 DIAGNOSIS — L570 Actinic keratosis: Secondary | ICD-10-CM | POA: Diagnosis not present

## 2018-06-05 DIAGNOSIS — L820 Inflamed seborrheic keratosis: Secondary | ICD-10-CM | POA: Diagnosis not present

## 2018-06-05 DIAGNOSIS — Z23 Encounter for immunization: Secondary | ICD-10-CM | POA: Diagnosis not present

## 2018-06-05 DIAGNOSIS — B351 Tinea unguium: Secondary | ICD-10-CM | POA: Diagnosis not present

## 2018-06-05 DIAGNOSIS — L821 Other seborrheic keratosis: Secondary | ICD-10-CM | POA: Diagnosis not present

## 2018-06-05 DIAGNOSIS — L603 Nail dystrophy: Secondary | ICD-10-CM | POA: Diagnosis not present

## 2018-06-05 DIAGNOSIS — L609 Nail disorder, unspecified: Secondary | ICD-10-CM | POA: Diagnosis not present

## 2018-06-11 DIAGNOSIS — S52552D Other extraarticular fracture of lower end of left radius, subsequent encounter for closed fracture with routine healing: Secondary | ICD-10-CM | POA: Diagnosis not present

## 2018-06-11 DIAGNOSIS — M65321 Trigger finger, right index finger: Secondary | ICD-10-CM | POA: Diagnosis not present

## 2018-06-18 ENCOUNTER — Encounter: Payer: Self-pay | Admitting: Cardiology

## 2018-06-18 ENCOUNTER — Ambulatory Visit (INDEPENDENT_AMBULATORY_CARE_PROVIDER_SITE_OTHER): Payer: Medicare Other | Admitting: Cardiology

## 2018-06-18 VITALS — BP 112/72 | HR 76 | Ht 61.5 in | Wt 165.4 lb

## 2018-06-18 DIAGNOSIS — I351 Nonrheumatic aortic (valve) insufficiency: Secondary | ICD-10-CM | POA: Diagnosis not present

## 2018-06-18 DIAGNOSIS — I493 Ventricular premature depolarization: Secondary | ICD-10-CM | POA: Diagnosis not present

## 2018-06-18 DIAGNOSIS — I4510 Unspecified right bundle-branch block: Secondary | ICD-10-CM | POA: Diagnosis not present

## 2018-06-18 DIAGNOSIS — I444 Left anterior fascicular block: Secondary | ICD-10-CM | POA: Diagnosis not present

## 2018-06-18 NOTE — Patient Instructions (Signed)
Medication Instructions:  The current medical regimen is effective;  continue present plan and medications.  If you need a refill on your cardiac medications before your next appointment, please call your pharmacy.   Testing/Procedures: Your physician has requested that you have an echocardiogram. Echocardiography is a painless test that uses sound waves to create images of your heart. It provides your doctor with information about the size and shape of your heart and how well your heart's chambers and valves are working. This procedure takes approximately one hour. There are no restrictions for this procedure.  Follow-Up: At [facility], you and your health needs are our priority.  As part of our continuing mission to provide you with exceptional heart care, we have created designated Provider Care Teams.  These Care Teams include your primary Cardiologist (physician) and Advanced Practice Providers (APPs -  Physician Assistants and Nurse Practitioners) who all work together to provide you with the care you need, when you need it. You will need a follow up appointment in 12 months.  Please call our office 2 months in advance to schedule this appointment.  You may see Mark Skains, MD or one of the following Advanced Practice Providers on your designated Care Team:   Lori Gerhardt, NP Laura Ingold, NP . Jill McDaniel, NP  Thank you for choosing [facility]!!     

## 2018-06-18 NOTE — Progress Notes (Addendum)
Cardiology Office Note    Date:  06/18/2018   ID:  Debbie Norris, DOB Feb 22, 1951, MRN [id number]  PCP:  Maurice Small, MD  Cardiologist:   Dr. Marlou Porch    History of Present Illness:  Debbie Norris is a 68 y.o. female last seen by [name] 03/25/15 with history of aortic insufficiency, PVCs here for follow-up.  Dizzy when tilts head back, chronically at times.  Sinus surgeries. No syncope.   PVC's with sudafed in the past. Wore monitor. Stopped hormone and it helped. She is a Marine scientist., retired [employer] scientist.   Diabetes allergic to metformin she states. Thought it may have been simvastatin at time. Tried Livalo--cramps. Atorvastatin, Pravastatin, Quit.   Mother had 49 MI. Mini-gastric bypass, 2006, when she had gall bladder removed.   06/04/17  - doing well, no chest pain, syncope, bleeding, orthopnea, PND. Very rare palpitations.   06/18/18 - here for follow up of aortic regurgitation. Mild left under the breast discomfort that is mild in intensity and fleeting. Non exertional.  Overall no fevers, chills, SOB, nausea, vomiting. No side effects of medication.   Past Medical History:  Diagnosis Date  . Abdominal pain   . Asthma   . Atrophic vaginitis   . Cancer ([place])    basel cell  . Depression   . Diabetes ([place])   . High blood pressure   . Hx of colonic polyps   . Hyperlipidemia   . Obesity    GASTRIC BYPASS IN 2006  . Vitamin D deficiency     Past Surgical History:  Procedure Laterality Date  . ABDOMINAL HYSTERECTOMY    . BREAST CYST ASPIRATION Right unsure   pt states 30+ years ago  . COLONOSCOPY    . FACIAL COSMETIC SURGERY    . GASTRIC BYPASS    . NASAL SINUS SURGERY      Current Medications: Outpatient Medications Prior to Visit  Medication Sig Dispense Refill  . albuterol (PROVENTIL HFA) 108 (90 Base) MCG/ACT inhaler TAKE 2 PUFFS BY MOUTH EVERY 4 TO 6 HOURS AS NEEDED 6.7 Inhaler 11  . ALPRAZolam (XANAX) 0.5 MG tablet Take 0.5 mg by mouth daily as needed  for anxiety.    . Calcium Carbonate Antacid (TUMS PO) Take 1,000 mg by mouth 3 (three) times daily.    . Coenzyme Q10 (Q-10 CO-ENZYME PO) Take 300 mg by mouth daily.    . diclofenac sodium (VOLTAREN) 1 % GEL As directed  1  . FLUoxetine (PROZAC) 40 MG capsule Take 40 mg by mouth daily.    . fluticasone (FLONASE) 50 MCG/ACT nasal spray Place 2 sprays into both nostrils daily.    . Fluticasone-Salmeterol (ADVAIR DISKUS) 250-50 MCG/DOSE AEPB USE 1 PUFF THEN RINSE MOUTH, TWICE DAILY 60 each 12  . glipiZIDE (GLUCOTROL XL) 5 MG 24 hr tablet Take 2.5 mg by mouth daily.   5  . montelukast (SINGULAIR) 10 MG tablet Take 1 tablet (10 mg total) by mouth daily. 90 tablet 3  . ONE TOUCH ULTRA TEST test strip 1 each by Other route as needed for other.   3  . pantoprazole (PROTONIX) 40 MG tablet Take 40 mg by mouth 2 (two) times daily at 10 am and 4 pm.     . rosuvastatin (CRESTOR) 5 MG tablet Take 5 mg by mouth. Twice a week(Sundays and Wednesdays)  1  . tolterodine (DETROL LA) 4 MG 24 hr capsule Take 1 capsule by mouth daily.     Marland Kitchen VAGIFEM 10  MCG TABS vaginal tablet Place 1 tablet vaginally 2 (two) times a week.   3  . vitamin B-12 (CYANOCOBALAMIN) 1000 MCG tablet Take 1,000 mcg by mouth daily.    . Vitamin D, Ergocalciferol, (DRISDOL) 50000 UNITS CAPS capsule Take 50,000 Units by mouth daily. Sundays    . zolpidem (AMBIEN) 10 MG tablet Take 10 mg by mouth at bedtime as needed for sleep.     Marland Kitchen amoxicillin-clavulanate (AUGMENTIN) 875-125 MG tablet Take 1 tablet by mouth 2 (two) times daily. (Patient not taking: Reported on 06/18/2018) 14 tablet 1   No facility-administered medications prior to visit.      Allergies:   Toradol [ketorolac tromethamine]; Bextra [valdecoxib]; Lipitor [atorvastatin]; Metformin and related; Other; Phenergan [promethazine hcl]; Pineapple; and Prinivil [lisinopril]   Social History   Socioeconomic History  . Marital status: Married    Spouse name: Not on file  . Number of  children: Not on file  . Years of education: Not on file  . Highest education level: Not on file  Occupational History  . Not on file  Social Needs  . Financial resource strain: Not on file  . Food insecurity:    Worry: Not on file    Inability: Not on file  . Transportation needs:    Medical: Not on file    Non-medical: Not on file  Tobacco Use  . Smoking status: Never Smoker  . Smokeless tobacco: Never Used  Substance and Sexual Activity  . Alcohol use: Yes    Comment: social  . Drug use: No  . Sexual activity: Not on file  Lifestyle  . Physical activity:    Days per week: Not on file    Minutes per session: Not on file  . Stress: Not on file  Relationships  . Social connections:    Talks on phone: Not on file    Gets together: Not on file    Attends religious service: Not on file    Active member of club or organization: Not on file    Attends meetings of clubs or organizations: Not on file    Relationship status: Not on file  Other Topics Concern  . Not on file  Social History Narrative  . Not on file     Family History:  The patient's family history includes Cancer in her father; Emphysema in her paternal aunt; Heart disease in her father and mother.   ROS:   Please see the history of present illness.    Review of Systems  All other systems reviewed and are negative.  All other systems reviewed and are negative.   PHYSICAL EXAM:   VS:  BP 112/72   Pulse 76   Ht 5' 1.5" (1.562 m)   Wt 165 lb 6.4 oz (75 kg)   BMI 30.75 kg/m    GEN: Well nourished, well developed, in no acute distress  HEENT: normal  Neck: no JVD, carotid bruits, or masses Cardiac: RRR; no murmurs, rubs, or gallops,no edema  Respiratory:  clear to auscultation bilaterally, normal work of breathing GI: soft, nontender, nondistended, + BS MS: no deformity or atrophy  Skin: warm and dry, no rash Neuro:  Alert and Oriented x 3, Strength and sensation are intact Psych: euthymic mood, full  affect    Wt Readings from Last 3 Encounters:  06/18/18 165 lb 6.4 oz (75 kg)  02/06/18 160 lb 6.4 oz (72.8 kg)  06/04/17 161 lb 3.2 oz (73.1 kg)      Studies/Labs  Reviewed:   EKG:  EKG is ordered today.  The ekg ordered today demonstrates 06/18/2018-right bundle branch block left anterior fascicular block bifascicular block normal sinus rhythm possible lateral infarct pattern personally reviewed and identified 06/04/17-normal sinus rhythm, right bundle branch block, left anterior fascicular block personally viewed-prior 03/16/16-sinus rhythm, right bundle branch block, left anterior fascicular block, bifascicular block no other significant abnormalities personally reviewed.  Recent Labs: No results found for requested labs within last 8760 hours.   Lipid Panel No results found for: CHOL, TRIG, HDL, CHOLHDL, VLDL, LDLCALC, LDLDIRECT  Additional studies/ records that were reviewed today include:   ECHO:01/05/16:  - Left ventricle: The cavity size was normal.Septal wall thickness   was increased in a pattern of moderate LVH. Systolic function was   vigorous. The estimated ejection fraction was in the range of 65%   to 70%. Wall motion was normal; there were no regional wall   motion abnormalities. Doppler parameters are consistent with   abnormal left ventricular relaxation (grade 1 diastolic   dysfunction). Doppler parameters are consistent with   indeterminate ventricular filling pressure. - Aortic valve: Transvalvular velocity was within the normal range.   There was no stenosis. There was mild regurgitation. - Mitral valve: Transvalvular velocity was within the normal range.   There was no evidence for stenosis. There was mild regurgitation. - Left atrium: The atrium was mildly dilated. - Right ventricle: The cavity size was normal. Wall thickness was   normal. Systolic function was normal. - Atrial septum: No defect or patent foramen ovale was identified. - Tricuspid valve:  There was mild regurgitation. - Pulmonary arteries: Systolic pressure was within the normal   range. PA peak pressure: 34 mm Hg (S).    ASSESSMENT:    1. Nonrheumatic aortic valve insufficiency   2. Left anterior fascicular block   3. Right bundle branch block   4. PVC (premature ventricular contraction)      PLAN:  In order of problems listed above:  Mild aortic regurgitation   - Previously described as moderate on prior echocardiogram. Most recently mild. No clinical change in symptoms.  Overall has been doing quite well.  She did have some atypical chest discomfort.  We will check an echocardiogram since it has been 3 years.  2017.  PVCs  - No PVCs noted on EKG today. Excellent. The seemed to be exacerbated by Sudafed/caffeine. Try to avoid. Doing well.  No worries.  Right bundle branch block/left anterior fascicular block/bifascicular block  - Overall doing well. No syncope. Explained to her that if her electrical system deteriorates further, she may need pacemaker in the future. No syncope.   Prior statin intolerance  - She has been off of statin now for several months. Crestor 5 now. Off Zetia (diarrhea).     Medication Adjustments/Labs and Tests Ordered: Current medicines are reviewed at length with the patient today.  Concerns regarding medicines are outlined above.  Medication changes, Labs and Tests ordered today are listed in the Patient Instructions below. Patient Instructions  Medication Instructions:  The current medical regimen is effective;  continue present plan and medications.  If you need a refill on your cardiac medications before your next appointment, please call your pharmacy.   Testing/Procedures: Your physician has requested that you have an echocardiogram. Echocardiography is a painless test that uses sound waves to create images of your heart. It provides your doctor with information about the size and shape of your heart and how well your heart's  chambers and valves are working. This procedure takes approximately one hour. There are no restrictions for this procedure.  Follow-Up: At [facility], you and your health needs are our priority.  As part of our continuing mission to provide you with exceptional heart care, we have created designated Provider Care Teams.  These Care Teams include your primary Cardiologist (physician) and Advanced Practice Providers (APPs -  Physician Assistants and Nurse Practitioners) who all work together to provide you with the care you need, when you need it. You will need a follow up appointment in 12 months.  Please call our office 2 months in advance to schedule this appointment.  You may see Candee Furbish, MD or one of the following Advanced Practice Providers on your designated Care Team:   Truitt Merle, NP Cecilie Kicks, NP . Kathyrn Drown, NP  Thank you for choosing [facility]!!         Signed, Candee Furbish, MD  06/18/2018 5:48 PM    [address], [place], [place]  [address] Phone: [phone]; Fax: [phone]

## 2018-06-21 ENCOUNTER — Ambulatory Visit (HOSPITAL_COMMUNITY): Payer: Medicare Other | Attending: Cardiology

## 2018-06-21 DIAGNOSIS — I351 Nonrheumatic aortic (valve) insufficiency: Secondary | ICD-10-CM | POA: Diagnosis not present

## 2018-07-03 DIAGNOSIS — M7552 Bursitis of left shoulder: Secondary | ICD-10-CM | POA: Diagnosis not present

## 2018-07-03 DIAGNOSIS — M25512 Pain in left shoulder: Secondary | ICD-10-CM | POA: Diagnosis not present

## 2018-07-04 DIAGNOSIS — M25512 Pain in left shoulder: Secondary | ICD-10-CM | POA: Diagnosis not present

## 2018-07-09 ENCOUNTER — Telehealth: Payer: Self-pay | Admitting: *Deleted

## 2018-07-09 DIAGNOSIS — M25512 Pain in left shoulder: Secondary | ICD-10-CM | POA: Diagnosis not present

## 2018-07-09 DIAGNOSIS — M75122 Complete rotator cuff tear or rupture of left shoulder, not specified as traumatic: Secondary | ICD-10-CM | POA: Diagnosis not present

## 2018-07-09 NOTE — Telephone Encounter (Signed)
   [facility] Pre-operative Risk Assessment    Request for surgical clearance:  1. What type of surgery is being performed? LEFT SHOULDER ARTHROSCOPY W/ROTATOR CUFF REPAIR, SUBACROMIAL DECOMPRESSION, DISTAL CLAVICLE EXCISION AND BICEPS TENTOMY   2. When is this surgery scheduled? 07/29/18   3. What type of clearance is required (medical clearance vs. Pharmacy clearance to hold med vs. Both)? MEDICAL  4. Are there any medications that need to be held prior to surgery and how long?NONE LISTED; SURGEON IS ASKING FOR RECENT EKG AND ECHO TO BE FAXED AS WELL    5. Practice name and name of physician performing surgery? [facility]; DR. Jenny Reichmann SPELLMAN   6. What is your office phone number [phone]    7.   What is your office fax number [phone]  8.   Anesthesia type (None, local, MAC, general) ? GENERAL ?    Julaine Hua 07/09/2018, 12:07 PM  _________________________________________________________________   (provider comments below)

## 2018-07-09 NOTE — Telephone Encounter (Signed)
   Primary Cardiologist: Candee Furbish, MD  Chart reviewed as part of pre-operative protocol coverage. Patient was contacted 07/09/2018 in reference to pre-operative risk assessment for pending surgery as outlined below.  Debbie Norris was last seen on 06/18/2018 by Dr. Marlou Porch.  Since that day, Debbie Norris has done well with no new cardiac complaints.  She is followed for aortic insufficiency, noted to be moderate in the past. Most recent echo no significant AR and no AS. Normal EF. She is also followed for palpitations found to be PVCs aggravated by stimulant medications. Now not causing any problems.   Therefore, based on ACC/AHA guidelines, the patient would be at acceptable risk for the planned procedure without further cardiovascular testing.   I will route this recommendation to the requesting party via Epic fax function and remove from pre-op pool.  I will also efax the latest echo and EKG per request.   Please call with questions.  Daune Perch, NP 07/09/2018, 1:28 PM

## 2018-07-12 DIAGNOSIS — Z01818 Encounter for other preprocedural examination: Secondary | ICD-10-CM | POA: Diagnosis not present

## 2018-07-19 ENCOUNTER — Telehealth: Payer: Self-pay | Admitting: *Deleted

## 2018-07-19 NOTE — Telephone Encounter (Signed)
REUQESTING LAST OFFICE NOTE FROM DR Marlou Porch   ROUTE LAST OFFICE NOT 06/18/18 AS REQUESTED

## 2018-08-07 ENCOUNTER — Ambulatory Visit: Payer: Self-pay | Admitting: Internal Medicine

## 2018-08-20 DIAGNOSIS — H1132 Conjunctival hemorrhage, left eye: Secondary | ICD-10-CM | POA: Diagnosis not present

## 2018-08-23 DIAGNOSIS — H1132 Conjunctival hemorrhage, left eye: Secondary | ICD-10-CM | POA: Diagnosis not present

## 2018-10-29 DIAGNOSIS — J45901 Unspecified asthma with (acute) exacerbation: Secondary | ICD-10-CM | POA: Diagnosis not present

## 2018-10-29 DIAGNOSIS — E1165 Type 2 diabetes mellitus with hyperglycemia: Secondary | ICD-10-CM | POA: Diagnosis not present

## 2018-10-29 DIAGNOSIS — I10 Essential (primary) hypertension: Secondary | ICD-10-CM | POA: Diagnosis not present

## 2018-10-29 DIAGNOSIS — E782 Mixed hyperlipidemia: Secondary | ICD-10-CM | POA: Diagnosis not present

## 2018-10-29 DIAGNOSIS — Z0000 Encounter for general adult medical examination without abnormal findings: Secondary | ICD-10-CM | POA: Diagnosis not present

## 2018-10-29 DIAGNOSIS — E559 Vitamin D deficiency, unspecified: Secondary | ICD-10-CM | POA: Diagnosis not present

## 2018-10-29 DIAGNOSIS — E611 Iron deficiency: Secondary | ICD-10-CM | POA: Diagnosis not present

## 2018-10-30 DIAGNOSIS — I10 Essential (primary) hypertension: Secondary | ICD-10-CM | POA: Diagnosis not present

## 2018-10-30 DIAGNOSIS — E559 Vitamin D deficiency, unspecified: Secondary | ICD-10-CM | POA: Diagnosis not present

## 2018-10-30 DIAGNOSIS — E1165 Type 2 diabetes mellitus with hyperglycemia: Secondary | ICD-10-CM | POA: Diagnosis not present

## 2018-10-30 DIAGNOSIS — M65321 Trigger finger, right index finger: Secondary | ICD-10-CM | POA: Diagnosis not present

## 2018-10-30 DIAGNOSIS — E611 Iron deficiency: Secondary | ICD-10-CM | POA: Diagnosis not present

## 2018-10-30 DIAGNOSIS — E782 Mixed hyperlipidemia: Secondary | ICD-10-CM | POA: Diagnosis not present

## 2018-11-06 DIAGNOSIS — D485 Neoplasm of uncertain behavior of skin: Secondary | ICD-10-CM | POA: Diagnosis not present

## 2018-11-06 DIAGNOSIS — C44712 Basal cell carcinoma of skin of right lower limb, including hip: Secondary | ICD-10-CM | POA: Diagnosis not present

## 2018-11-13 ENCOUNTER — Other Ambulatory Visit: Payer: Self-pay

## 2018-11-13 ENCOUNTER — Ambulatory Visit (INDEPENDENT_AMBULATORY_CARE_PROVIDER_SITE_OTHER): Payer: Medicare Other | Admitting: Internal Medicine

## 2018-11-13 ENCOUNTER — Encounter: Payer: Self-pay | Admitting: Internal Medicine

## 2018-11-13 DIAGNOSIS — J3089 Other allergic rhinitis: Secondary | ICD-10-CM

## 2018-11-13 DIAGNOSIS — H60501 Unspecified acute noninfective otitis externa, right ear: Secondary | ICD-10-CM | POA: Diagnosis not present

## 2018-11-13 DIAGNOSIS — J302 Other seasonal allergic rhinitis: Secondary | ICD-10-CM

## 2018-11-13 DIAGNOSIS — J449 Chronic obstructive pulmonary disease, unspecified: Secondary | ICD-10-CM | POA: Diagnosis not present

## 2018-11-13 DIAGNOSIS — J4489 Other specified chronic obstructive pulmonary disease: Secondary | ICD-10-CM

## 2018-11-13 NOTE — Progress Notes (Signed)
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 Allergy Profile-09/10/2013-negative-total IgE 12.5 with no specific elevations PFT 11/25/13-within normal limits with insignificant response to bronchodilator Triggers for asthma include strong odors, seasonal pollens, sinus infections, and the room at work which she goes into intermittently- experiencing hoarseness and cough. FENO 01/02/17- 12 ( not elevated) Office Spirometry 8/128/18- WNL-FVC 2.79/106%, FEV1 2.13/112%, ratio 0.77, FEF 25-75% 1.95/129% -----------------------------------------------------------------------------------  02/06/2018- 68 year old female never smoker followed for Asthma, allergic rhinitis, recurrent acute maxillary sinusitis complicated by GERD, aortic regurgitation, DM2 -----Asthma; Cough-productive-white in color; PND as well.  Proventil HFA, Singulair, Advair 250, Flonase, Most of her respiratory problems began as sinus infections.  She kept Augmentin from last time it was prescribed and took it for an active sinusitis while in [place].  Nose has remained somewhat stuffy through September.  Does saline sinus rinse.  Notes mild postnasal drip and chest congestion with cough productive scant white sputum, no fever or sore throat. Using Flonase twice daily. IgE was only 19.   11/13/2018- Virtual Visit via Telephone Note  I connected with Debbie Norris on 11/13/18 at 10:30 AM EDT by telephone and verified that I am speaking with the correct person using two identifiers.  Location: Patient: H Provider: O   I discussed the limitations, risks, security and privacy concerns of performing an evaluation and management service by telephone and the availability of in person appointments. I also discussed with the patient that there may be a patient responsible charge related to this  service. The patient expressed understanding and agreed to proceed.   History of Present Illness: 68 year old female never smoker followed for Asthma, allergic rhinitis, recurrent acute maxillary sinusitis complicated by GERD, aortic regurgitation, DM2 -----recommended a couple years ago to stop Advair; weaned off had not had any since May, also stopped Singulair; has had hx of depression that she takes medication for (saw Singulair had blackbox on depression), still doing flonase bid Notices mainly sinus congestion. Breathing ok off Advair and Singulair as we suggested, since May 1. Occ cough- white. Rarely needs albuterol rescue.  Using Flonase twice daily for now. Notes R ear pain. No return with saline nasal rinse. Occ sudafed, zyrtec.    Observations/Objective:   Assessment and Plan: Rhinitis, possible otitis externa- ok to take her augmentin[place] Asthma- mild well controlled, uncomplicated  Follow Up Instructions: 6 months   I discussed the assessment and treatment plan with the patient. The patient was provided an opportunity to ask questions and all were answered. The patient agreed with the plan and demonstrated an understanding of the instructions.   The patient was advised to call back or seek an in-person evaluation if the symptoms worsen or if the condition fails to improve as anticipated.  I provided 21 minutes of non-face-to-face time during this encounter.   Baird Lyons, MD   ROS-see HPI    + = positive Constitutional:   No-   weight loss, night sweats, fevers, chills, fatigue, lassitude. HEENT:   No-  headaches, +difficulty swallowing, tooth/dental problems, sore throat,       Sneezing, itching, +ear ache, +nasal congestion, post nasal drip,  CV:  No-   chest pain, orthopnea, PND, swelling in lower extremities, anasarca,  dizziness, palpitations Resp:  no-shortness of breath with exertion or at rest.              productive cough, non-productive cough,  No- coughing up of blood.              No-   change in color of mucus.  No- wheezing.   Skin: No-   rash or lesions. GI:  No-heartburn, indigestion, abdominal pain, nausea, vomiting, GU:  MS:  +  joint pain or swelling.  No- decreased range of motion.  No- back pain. Neuro-     nothing unusual Psych:  No- change in mood or affect. + depression or anxiety.  No memory loss.  OBJ- Physical Exam General- Alert, Oriented, Affect-appropriate, Distress- none acute Skin- rash-none, lesions- none, excoriation- none Lymphadenopathy- none Head- atraumatic            Eyes- Gross vision intact, PERRLA, conjunctivae and secretions clear            Ears- Hearing, canals-normal            Nose- mucosa normal, no-Septal dev, +, no-polyps, erosion, perforation             Throat- Mallampati II , mucosa clear, drainage- none, tonsils- atrophic Neck- flexible , trachea midline, no stridor , thyroid nl, carotid no bruit Chest - symmetrical excursion , unlabored           Heart/CV- RRR ,  Murmur +1/6 syst A.I. , no gallop  , no rub, nl s1 s2                           - JVD- none , edema- none, stasis changes- none, varices- none           Lung- clear to P&A, wheeze- none, cough+slight , dullness-none, rub- none           Chest wall-  Abd-  Br/ Gen/ Rectal- Not done, not indicated Extrem- cyanosis- none, clubbing, none, atrophy- none, strength- nl Neuro- grossly intact to observation

## 2018-11-13 NOTE — Patient Instructions (Signed)
Ok to just use your rescue inhaler when needed and see how you do off Advair and Singulair.  Ok to go ahead and take your aumentin. Hopefully the earache will ease up quickly.   Please call if we can help

## 2018-12-12 DIAGNOSIS — L905 Scar conditions and fibrosis of skin: Secondary | ICD-10-CM | POA: Diagnosis not present

## 2018-12-12 DIAGNOSIS — C44712 Basal cell carcinoma of skin of right lower limb, including hip: Secondary | ICD-10-CM | POA: Diagnosis not present

## 2019-01-05 DIAGNOSIS — S20212A Contusion of left front wall of thorax, initial encounter: Secondary | ICD-10-CM | POA: Diagnosis not present

## 2019-01-07 ENCOUNTER — Other Ambulatory Visit: Payer: Self-pay | Admitting: Family Medicine

## 2019-01-07 DIAGNOSIS — Z1231 Encounter for screening mammogram for malignant neoplasm of breast: Secondary | ICD-10-CM

## 2019-01-08 DIAGNOSIS — L570 Actinic keratosis: Secondary | ICD-10-CM | POA: Diagnosis not present

## 2019-01-08 DIAGNOSIS — Z85828 Personal history of other malignant neoplasm of skin: Secondary | ICD-10-CM | POA: Diagnosis not present

## 2019-01-08 DIAGNOSIS — L821 Other seborrheic keratosis: Secondary | ICD-10-CM | POA: Diagnosis not present

## 2019-01-08 DIAGNOSIS — L815 Leukoderma, not elsewhere classified: Secondary | ICD-10-CM | POA: Diagnosis not present

## 2019-01-11 DIAGNOSIS — H6090 Unspecified otitis externa, unspecified ear: Secondary | ICD-10-CM | POA: Insufficient documentation

## 2019-01-11 NOTE — Assessment & Plan Note (Signed)
Flonase and zyrtec usually sufficient

## 2019-01-11 NOTE — Assessment & Plan Note (Signed)
Suggested by her description. She has augmentin on hand and will use that. If no better, see her PCP or local ENT

## 2019-01-11 NOTE — Assessment & Plan Note (Signed)
Mild intermittent and uncomplicated now Plan- rescue inhaler if needed. Resume maintenance controller if needed

## 2019-01-28 DIAGNOSIS — Z23 Encounter for immunization: Secondary | ICD-10-CM | POA: Diagnosis not present

## 2019-02-27 ENCOUNTER — Other Ambulatory Visit: Payer: Self-pay

## 2019-02-27 ENCOUNTER — Ambulatory Visit
Admission: RE | Admit: 2019-02-27 | Discharge: 2019-02-27 | Disposition: A | Discharge status: Home / Self Care | Payer: Medicare Other | Source: Ambulatory Visit | Attending: Family Medicine | Admitting: Family Medicine

## 2019-02-27 DIAGNOSIS — H5203 Hypermetropia, bilateral: Secondary | ICD-10-CM | POA: Diagnosis not present

## 2019-02-27 DIAGNOSIS — Z1231 Encounter for screening mammogram for malignant neoplasm of breast: Secondary | ICD-10-CM

## 2019-02-27 DIAGNOSIS — E119 Type 2 diabetes mellitus without complications: Secondary | ICD-10-CM | POA: Diagnosis not present

## 2019-02-27 DIAGNOSIS — H52203 Unspecified astigmatism, bilateral: Secondary | ICD-10-CM | POA: Diagnosis not present

## 2019-03-07 DIAGNOSIS — R0781 Pleurodynia: Secondary | ICD-10-CM | POA: Diagnosis not present

## 2019-03-07 DIAGNOSIS — W19XXXA Unspecified fall, initial encounter: Secondary | ICD-10-CM | POA: Diagnosis not present

## 2019-04-10 ENCOUNTER — Other Ambulatory Visit: Payer: Self-pay | Admitting: Orthopedic Surgery

## 2019-04-15 DIAGNOSIS — M65321 Trigger finger, right index finger: Secondary | ICD-10-CM | POA: Diagnosis not present

## 2019-04-21 ENCOUNTER — Encounter (HOSPITAL_BASED_OUTPATIENT_CLINIC_OR_DEPARTMENT_OTHER): Payer: Self-pay | Admitting: Orthopedic Surgery

## 2019-04-21 ENCOUNTER — Encounter (HOSPITAL_BASED_OUTPATIENT_CLINIC_OR_DEPARTMENT_OTHER)
Admission: RE | Admit: 2019-04-21 | Discharge: 2019-04-21 | Disposition: A | Discharge status: Home / Self Care | Payer: Medicare Other | Source: Ambulatory Visit | Attending: Orthopedic Surgery | Admitting: Orthopedic Surgery

## 2019-04-21 ENCOUNTER — Other Ambulatory Visit: Payer: Self-pay

## 2019-04-21 ENCOUNTER — Other Ambulatory Visit (HOSPITAL_COMMUNITY)
Admission: RE | Admit: 2019-04-21 | Discharge: 2019-04-21 | Disposition: A | Discharge status: Home / Self Care | Payer: Medicare Other | Source: Ambulatory Visit | Attending: Orthopedic Surgery | Admitting: Orthopedic Surgery

## 2019-04-21 DIAGNOSIS — Z01812 Encounter for preprocedural laboratory examination: Secondary | ICD-10-CM | POA: Insufficient documentation

## 2019-04-21 DIAGNOSIS — Z20828 Contact with and (suspected) exposure to other viral communicable diseases: Secondary | ICD-10-CM | POA: Diagnosis not present

## 2019-04-21 LAB — BASIC METABOLIC PANEL
Anion gap: 13 (ref 5–15)
BUN: 16 mg/dL (ref 8–23)
CO2: 23 mmol/L (ref 22–32)
Calcium: 9.1 mg/dL (ref 8.9–10.3)
Chloride: 103 mmol/L (ref 98–111)
Creatinine, Ser: 1.18 mg/dL — ABNORMAL HIGH (ref 0.44–1.00)
GFR calc Af Amer: 55 mL/min — ABNORMAL LOW (ref >60)
GFR calc non Af Amer: 48 mL/min — ABNORMAL LOW (ref >60)
Glucose, Bld: 157 mg/dL — ABNORMAL HIGH (ref 70–99)
Potassium: 4.9 mmol/L (ref 3.5–5.1)
Sodium: 139 mmol/L (ref 135–145)

## 2019-04-21 NOTE — Progress Notes (Signed)

## 2019-04-21 NOTE — Progress Notes (Signed)
Cardiac history reviewed by Dr. Conrad Early and will proceed with surgery as planned at [facility].

## 2019-04-22 LAB — NOVEL CORONAVIRUS, NAA (HOSP ORDER, SEND-OUT TO REF LAB; TAT 18-24 HRS): SARS-CoV-2, NAA: NOT DETECTED

## 2019-04-24 ENCOUNTER — Ambulatory Visit (HOSPITAL_BASED_OUTPATIENT_CLINIC_OR_DEPARTMENT_OTHER): Payer: Medicare Other | Admitting: Anesthesiology

## 2019-04-24 ENCOUNTER — Encounter (HOSPITAL_BASED_OUTPATIENT_CLINIC_OR_DEPARTMENT_OTHER)
Admission: RE | Disposition: A | Discharge status: Home / Self Care | Payer: Self-pay | Source: Home / Self Care | Attending: Orthopedic Surgery

## 2019-04-24 ENCOUNTER — Ambulatory Visit (HOSPITAL_BASED_OUTPATIENT_CLINIC_OR_DEPARTMENT_OTHER)
Admission: RE | Admit: 2019-04-24 | Discharge: 2019-04-24 | Disposition: A | Discharge status: Home / Self Care | Payer: Medicare Other | Attending: Orthopedic Surgery | Admitting: Orthopedic Surgery

## 2019-04-24 ENCOUNTER — Encounter (HOSPITAL_BASED_OUTPATIENT_CLINIC_OR_DEPARTMENT_OTHER): Payer: Self-pay | Admitting: Orthopedic Surgery

## 2019-04-24 DIAGNOSIS — Z85828 Personal history of other malignant neoplasm of skin: Secondary | ICD-10-CM | POA: Diagnosis not present

## 2019-04-24 DIAGNOSIS — Z7984 Long term (current) use of oral hypoglycemic drugs: Secondary | ICD-10-CM | POA: Insufficient documentation

## 2019-04-24 DIAGNOSIS — K219 Gastro-esophageal reflux disease without esophagitis: Secondary | ICD-10-CM | POA: Insufficient documentation

## 2019-04-24 DIAGNOSIS — E119 Type 2 diabetes mellitus without complications: Secondary | ICD-10-CM | POA: Diagnosis not present

## 2019-04-24 DIAGNOSIS — M65321 Trigger finger, right index finger: Secondary | ICD-10-CM | POA: Diagnosis not present

## 2019-04-24 DIAGNOSIS — I10 Essential (primary) hypertension: Secondary | ICD-10-CM | POA: Diagnosis not present

## 2019-04-24 DIAGNOSIS — J45909 Unspecified asthma, uncomplicated: Secondary | ICD-10-CM | POA: Diagnosis not present

## 2019-04-24 DIAGNOSIS — Z79899 Other long term (current) drug therapy: Secondary | ICD-10-CM | POA: Diagnosis not present

## 2019-04-24 DIAGNOSIS — Z9884 Bariatric surgery status: Secondary | ICD-10-CM | POA: Diagnosis not present

## 2019-04-24 DIAGNOSIS — E785 Hyperlipidemia, unspecified: Secondary | ICD-10-CM | POA: Insufficient documentation

## 2019-04-24 DIAGNOSIS — F329 Major depressive disorder, single episode, unspecified: Secondary | ICD-10-CM | POA: Diagnosis not present

## 2019-04-24 DIAGNOSIS — E559 Vitamin D deficiency, unspecified: Secondary | ICD-10-CM | POA: Diagnosis not present

## 2019-04-24 HISTORY — DX: Other complications of anesthesia, initial encounter: T88.59XA

## 2019-04-24 HISTORY — DX: Nausea with vomiting, unspecified: R11.2

## 2019-04-24 HISTORY — DX: Other specified postprocedural states: Z98.890

## 2019-04-24 HISTORY — PX: TRIGGER FINGER RELEASE: SHX641

## 2019-04-24 LAB — GLUCOSE, CAPILLARY
Glucose-Capillary: 150 mg/dL — ABNORMAL HIGH (ref 70–99)
Glucose-Capillary: 93 mg/dL (ref 70–99)

## 2019-04-24 SURGERY — RELEASE, A1 PULLEY, FOR TRIGGER FINGER
Anesthesia: Regional | Site: Finger | Laterality: Right

## 2019-04-24 MED ORDER — FENTANYL CITRATE (PF) 100 MCG/2ML IJ SOLN
INTRAMUSCULAR | Status: AC
  Filled 2019-04-24: qty 2

## 2019-04-24 MED ORDER — CHLORHEXIDINE GLUCONATE 4 % EX LIQD: 60.0000 mL | Freq: Once | CUTANEOUS | Status: DC

## 2019-04-24 MED ORDER — MIDAZOLAM HCL 2 MG/2ML IJ SOLN
INTRAMUSCULAR | Status: AC
  Filled 2019-04-24: qty 2

## 2019-04-24 MED ORDER — CEFAZOLIN SODIUM-DEXTROSE 2-4 GM/100ML-% IV SOLN: 2.0000 g | INTRAVENOUS | Status: DC

## 2019-04-24 MED ORDER — MIDAZOLAM HCL 2 MG/2ML IJ SOLN
1.0000 mg | INTRAMUSCULAR | Status: DC | PRN
  Administered 2019-04-24: 2 mg via INTRAVENOUS

## 2019-04-24 MED ORDER — OXYCODONE HCL 5 MG/5ML PO SOLN: 5.0000 mg | Freq: Once | ORAL | Status: DC | PRN

## 2019-04-24 MED ORDER — BUPIVACAINE HCL (PF) 0.25 % IJ SOLN
INTRAMUSCULAR | Status: AC
  Filled 2019-04-24: qty 60

## 2019-04-24 MED ORDER — HYDROCODONE-ACETAMINOPHEN 5-325 MG PO TABS: ORAL_TABLET | ORAL | 0 refills | Status: DC

## 2019-04-24 MED ORDER — FENTANYL CITRATE (PF) 100 MCG/2ML IJ SOLN
50.0000 ug | INTRAMUSCULAR | Status: DC | PRN
  Administered 2019-04-24 (×2): 50 ug via INTRAVENOUS

## 2019-04-24 MED ORDER — HYDROMORPHONE HCL 1 MG/ML IJ SOLN: 0.2500 mg | INTRAMUSCULAR | Status: DC | PRN

## 2019-04-24 MED ORDER — BUPIVACAINE HCL (PF) 0.25 % IJ SOLN
INTRAMUSCULAR | Status: DC | PRN
  Administered 2019-04-24: 7 mL

## 2019-04-24 MED ORDER — PROPOFOL 10 MG/ML IV BOLUS
INTRAVENOUS | Status: DC | PRN
  Administered 2019-04-24 (×2): 20 mg via INTRAVENOUS

## 2019-04-24 MED ORDER — OXYCODONE HCL 5 MG PO TABS: 5.0000 mg | ORAL_TABLET | Freq: Once | ORAL | Status: DC | PRN

## 2019-04-24 MED ORDER — LACTATED RINGERS IV SOLN: INTRAVENOUS | Status: DC

## 2019-04-24 MED ORDER — CEFAZOLIN SODIUM-DEXTROSE 2-4 GM/100ML-% IV SOLN
INTRAVENOUS | Status: AC
  Filled 2019-04-24: qty 100

## 2019-04-24 MED ORDER — ONDANSETRON HCL 4 MG/2ML IJ SOLN
INTRAMUSCULAR | Status: DC | PRN
  Administered 2019-04-24: 4 mg via INTRAVENOUS

## 2019-04-24 SURGICAL SUPPLY — 34 items
BLADE SURG 15 STRL LF DISP TIS (BLADE) ×2 IMPLANT
BLADE SURG 15 STRL SS (BLADE) ×4
BNDG COHESIVE 2X5 TAN STRL LF (GAUZE/BANDAGES/DRESSINGS) ×3 IMPLANT
BNDG ESMARK 4X9 LF (GAUZE/BANDAGES/DRESSINGS) IMPLANT
CHLORAPREP W/TINT 26 (MISCELLANEOUS) ×3 IMPLANT
CORD BIPOLAR FORCEPS 12FT (ELECTRODE) ×3 IMPLANT
COVER BACK TABLE REUSABLE LG (DRAPES) ×3 IMPLANT
COVER MAYO STAND REUSABLE (DRAPES) ×3 IMPLANT
COVER WAND RF STERILE (DRAPES) IMPLANT
CUFF TOURN SGL QUICK 18X4 (TOURNIQUET CUFF) ×3 IMPLANT
DRAPE EXTREMITY T 121X128X90 (DISPOSABLE) ×3 IMPLANT
DRAPE SURG 17X23 STRL (DRAPES) ×3 IMPLANT
GAUZE SPONGE 4X4 12PLY STRL (GAUZE/BANDAGES/DRESSINGS) ×3 IMPLANT
GAUZE XEROFORM 1X8 LF (GAUZE/BANDAGES/DRESSINGS) ×3 IMPLANT
GLOVE BIO SURGEON STRL SZ 6.5 (GLOVE) ×2 IMPLANT
GLOVE BIO SURGEON STRL SZ7.5 (GLOVE) ×3 IMPLANT
GLOVE BIO SURGEONS STRL SZ 6.5 (GLOVE) ×2
GLOVE BIOGEL PI IND STRL 6.5 (GLOVE) IMPLANT
GLOVE BIOGEL PI IND STRL 8 (GLOVE) ×1 IMPLANT
GLOVE BIOGEL PI INDICATOR 6.5 (GLOVE) ×4
GLOVE BIOGEL PI INDICATOR 8 (GLOVE) ×2
GOWN STRL REUS W/ TWL LRG LVL3 (GOWN DISPOSABLE) ×1 IMPLANT
GOWN STRL REUS W/TWL LRG LVL3 (GOWN DISPOSABLE) ×2
GOWN STRL REUS W/TWL XL LVL3 (GOWN DISPOSABLE) ×3 IMPLANT
NDL HYPO 25X1 1.5 SAFETY (NEEDLE) ×1 IMPLANT
NEEDLE HYPO 25X1 1.5 SAFETY (NEEDLE) ×3 IMPLANT
NS IRRIG 1000ML POUR BTL (IV SOLUTION) ×3 IMPLANT
PACK BASIN DAY SURGERY FS (CUSTOM PROCEDURE TRAY) ×3 IMPLANT
STOCKINETTE 4X48 STRL (DRAPES) ×3 IMPLANT
SUT ETHILON 4 0 PS 2 18 (SUTURE) ×3 IMPLANT
SYR BULB 3OZ (MISCELLANEOUS) ×3 IMPLANT
SYR CONTROL 10ML LL (SYRINGE) ×3 IMPLANT
TOWEL GREEN STERILE FF (TOWEL DISPOSABLE) ×6 IMPLANT
UNDERPAD 30X36 HEAVY ABSORB (UNDERPADS AND DIAPERS) ×3 IMPLANT

## 2019-04-24 NOTE — Discharge Instructions (Addendum)

## 2019-04-24 NOTE — Op Note (Signed)
04/24/2019 [facility]  Operative Note  PREOPERATIVE DIAGNOSIS: TRIGGER RIGHT INDEX FINGER  POSTOPERATIVE DIAGNOSIS:  TRIGGER RIGHT INDEX FINGER  PROCEDURE: Procedure(s): RELEASE TRIGGER FINGER/A-1 PULLEY RIGHT INDEX FINGER  SURGEON:  Leanora Cover, MD  ASSISTANT:  none.  ANESTHESIA:  Bier block with sedation.  IV FLUIDS:  Per anesthesia flow sheet.  ESTIMATED BLOOD LOSS:  Minimal.  COMPLICATIONS:  None.  SPECIMENS:  None.  TOURNIQUET TIME:  Total Tourniquet Time Documented: Forearm (Right) - 19 minutes Total: Forearm (Right) - 19 minutes   DISPOSITION:  Stable to PACU.  LOCATION: [facility]  INDICATIONS: Debbie Norris is a 68 y.o. female with triggering right index finger.  This has been injected without lasting relief.  She wishes to have a right index finger trigger release.  Risks, benefits and alternatives of surgery were discussed including the risk of blood loss, infection, damage to nerves, vessels, tendons, ligaments, bone, failure of surgery, need for additional surgery, complications with wound healing, continued pain, continued triggering and need for repeat surgery.  She voiced understanding of these risks and elected to proceed.  OPERATIVE COURSE:  After being identified preoperatively by myself, the patient and I agreed upon the procedure and site of procedure.  The surgical site was marked. The risks, benefits, and alternatives of surgery were reviewed and she wished to proceed.  Surgical consent had been signed. She was given IV Ancef as preoperative antibiotic prophylaxis. She was transported to the operating room and placed on the operating room table in supine position with the Right upper extremity on an arm board. Bier block anesthesia was induced by the anesthesiologist.  The Right upper extremity was prepped and draped in normal sterile orthopedic fashion. A surgical pause was performed between surgeons, anesthesia, and  operating room staff, and all were in agreement as to the patient, procedure, and site of procedure.  Tourniquet at the proximal aspect of the forearm had been inflated for the Bier block.  An incision was made at the volar aspect of the MP joint of the index finger.  This was carried into the subcutaneous tissues by preading technique.  The surgical site was injected with 0.25% plain Marcaine to aid in postoperative analgesia. The radial and ulnar digital nerves were protected throughout the case. The flexor sheath was identified.  The A1 pulley was identified and sharply incised.  It was released in its entirety.  The proximal 1-2 mm of the A2 pulley was vented to allow better excursion of the tendons.  The finger was placed through a range of motion and there was noted to be no catching.  The tendons were brought through the wound and any adherences released.  The wound was then copiously irrigated with sterile saline. It was closed with 4-0 nylon in a horizontal mattress fashion.  It was dressed with sterile Xeroform, 4x4s, and wrapped lightly with a Coban dressing.  Tourniquet was deflated at 19 minutes.  The fingertips were pink with brisk capillary refill after deflation of the tourniquet.  The operative drapes were broken down and the patient was awoken from anesthesia safely.  She was transferred back to the stretcher and taken to the PACU in stable condition.   I will see her back in the office in 1 week for postoperative followup.  I will give her a prescription for Norco 5/325 1-2 tabs PO q6 hours prn pain, dispense # 20.    Leanora Cover, MD Electronically signed, 04/24/19

## 2019-04-24 NOTE — Anesthesia Postprocedure Evaluation (Signed)
Anesthesia Post Note  Patient: Debbie Norris  Procedure(s) Performed: RELEASE TRIGGER FINGER/A-1 PULLEY RIGHT INDEX FINGER (Right Finger)     Patient location during evaluation: PACU Anesthesia Type: Bier Block Level of consciousness: awake and alert Pain management: pain level controlled Vital Signs Assessment: post-procedure vital signs reviewed and stable Respiratory status: spontaneous breathing, nonlabored ventilation and respiratory function stable Cardiovascular status: blood pressure returned to baseline and stable Postop Assessment: no apparent nausea or vomiting Anesthetic complications: no    Last Vitals:  Vitals:   04/24/19 1400 04/24/19 1420  BP: 137/64 (!) 141/61  Pulse: 69 67  Resp: 14 16  Temp:  36.6 C  SpO2: 94% 98%    Last Pain:  Vitals:   04/24/19 1420  TempSrc: Oral  PainSc: 0-No pain                 Lynda Rainwater

## 2019-04-24 NOTE — H&P (Signed)
Debbie Norris is an 68 y.o. female.   Chief Complaint: right index trigger HPI: 68 yo female with triggering right index finger.  This has been injected without lasting relief.  She wishes to have a trigger release.  Allergies:  Allergies  Allergen Reactions  . Toradol [Ketorolac Tromethamine]     swelling  . Sevoflurane Nausea And Vomiting    Patient states had immediate projectile vomiting.  1998 and 2013  . Bextra [Valdecoxib] Swelling  . Lipitor [Atorvastatin]     Muscle pain, high level CPK  . Metformin And Related     Muscle pain/cramps  . Other     All vaginal / genital creams: causes severe rash with itching  . Phenergan [Promethazine Hcl]     Itchy   . Pineapple Hives    Stomach cramps  . Prinivil [Lisinopril] Cough    Past Medical History:  Diagnosis Date  . Abdominal pain   . Asthma   . Atrophic vaginitis   . Cancer ([place])    basel cell  . Complication of anesthesia   . Depression   . Diabetes ([place])   . High blood pressure   . Hx of colonic polyps   . Hyperlipidemia   . Obesity    GASTRIC BYPASS IN 2006  . PONV (postoperative nausea and vomiting)    with inhaled anesthetic   . Vitamin D deficiency     Past Surgical History:  Procedure Laterality Date  . ABDOMINAL HYSTERECTOMY    . BREAST CYST ASPIRATION Right unsure   pt states 30+ years ago  . COLONOSCOPY    . FACIAL COSMETIC SURGERY    . GASTRIC BYPASS    . NASAL SINUS SURGERY      Family History: Family History  Problem Relation Age of Onset  . Emphysema Paternal Aunt   . Heart disease Mother   . Heart disease Father   . Cancer Father        [place] Cell    Social History:   reports that she has never smoked. She has never used smokeless tobacco. She reports current alcohol use. She reports that she does not use drugs.  Medications: Medications Prior to Admission  Medication Sig Dispense Refill  . ALPRAZolam (XANAX) 0.5 MG tablet Take 0.5 mg by mouth daily as needed for  anxiety.    . Calcium Carbonate Antacid (TUMS PO) Take 1,000 mg by mouth 3 (three) times daily.    . cetirizine (ZYRTEC) 10 MG chewable tablet Chew 10 mg by mouth daily.    . Coenzyme Q10 (Q-10 CO-ENZYME PO) Take 300 mg by mouth daily.    . diclofenac sodium (VOLTAREN) 1 % GEL As directed  1  . Ferrous Sulfate (SLOW FE PO) Take by mouth.    Marland Kitchen FLUoxetine (PROZAC) 40 MG capsule Take 40 mg by mouth daily.    . fluticasone (FLONASE) 50 MCG/ACT nasal spray Place 2 sprays into both nostrils daily.    Marland Kitchen glipiZIDE (GLUCOTROL XL) 5 MG 24 hr tablet Take 2.5 mg by mouth daily.   5  . pantoprazole (PROTONIX) 40 MG tablet Take 40 mg by mouth 2 (two) times daily at 10 am and 4 pm.     . rosuvastatin (CRESTOR) 5 MG tablet Take 5 mg by mouth. Twice a week(Sundays and Wednesdays)  1  . tolterodine (DETROL LA) 4 MG 24 hr capsule Take 1 capsule by mouth daily.     Marland Kitchen VAGIFEM 10 MCG TABS vaginal tablet Place 1  tablet vaginally 2 (two) times a week.   3  . vitamin B-12 (CYANOCOBALAMIN) 1000 MCG tablet Take 1,000 mcg by mouth daily.    . Vitamin D, Ergocalciferol, (DRISDOL) 50000 UNITS CAPS capsule Take 50,000 Units by mouth daily. Sundays    . zolpidem (AMBIEN) 10 MG tablet Take 10 mg by mouth at bedtime as needed for sleep.     Marland Kitchen albuterol (PROVENTIL HFA) 108 (90 Base) MCG/ACT inhaler TAKE 2 PUFFS BY MOUTH EVERY 4 TO 6 HOURS AS NEEDED 6.7 Inhaler 11  . ONE TOUCH ULTRA TEST test strip 1 each by Other route as needed for other.   3    Results for orders placed or performed during the hospital encounter of 04/24/19 (from the past 48 hour(s))  Glucose, capillary     Status: Abnormal   Collection Time: 04/24/19 12:18 PM  Result Value Ref Range   Glucose-Capillary 150 (H) 70 - 99 mg/dL    No results found.   A comprehensive review of systems was negative.  Blood pressure 128/70, pulse 73, temperature 98.4 F (36.9 C), temperature source Temporal, height 5\' 2"  (1.575 m), weight 76.5 kg, SpO2 100 %.  General  appearance: alert, cooperative and appears stated age Head: Normocephalic, without obvious abnormality, atraumatic Neck: supple, symmetrical, trachea midline Cardio: regular rate and rhythm Resp: clear to auscultation bilaterally Extremities: Intact sensation and capillary refill all digits.  +epl/fpl/io.  No wounds.  Pulses: 2+ and symmetric Skin: Skin color, texture, turgor normal. No rashes or lesions Neurologic: Grossly normal Incision/Wound: none  Assessment/Plan Right index finger trigger digit.  Non operative and operative treatment options have been discussed with the patient and patient wishes to proceed with operative treatment. Risks, benefits, and alternatives of surgery have been discussed and the patient agrees with the plan of care.    Leanora Cover 04/24/2019, 12:35 PM

## 2019-04-24 NOTE — Transfer of Care (Signed)
Immediate Anesthesia Transfer of Care Note  Patient: Debbie Norris  Procedure(s) Performed: RELEASE TRIGGER FINGER/A-1 PULLEY RIGHT INDEX FINGER (Right Finger)  Patient Location: PACU  Anesthesia Type:MAC and Bier block  Level of Consciousness: awake, alert  and oriented  Airway & Oxygen Therapy: Patient Spontanous Breathing and Patient connected to face mask oxygen  Post-op Assessment: Report given to RN and Post -op Vital signs reviewed and stable  Post vital signs: Reviewed and stable  Last Vitals:  Vitals Value Taken Time  BP    Temp    Pulse    Resp    SpO2      Last Pain:  Vitals:   04/24/19 1224  TempSrc: Temporal  PainSc: 0-No pain         Complications: No apparent anesthesia complications

## 2019-04-24 NOTE — Anesthesia Preprocedure Evaluation (Signed)
Anesthesia Evaluation  Patient identified by MRN, date of birth, ID band Patient awake    Reviewed: Allergy & Precautions, NPO status , Patient's Chart, lab work & pertinent test results  History of Anesthesia Complications (+) PONV  Airway Mallampati: II  TM Distance: >3 FB Neck ROM: Full    Dental no notable dental hx.    Pulmonary asthma ,    Pulmonary exam normal breath sounds clear to auscultation       Cardiovascular hypertension, negative cardio ROS Normal cardiovascular exam Rhythm:Regular Rate:Normal     Neuro/Psych Depression negative neurological ROS  negative psych ROS   GI/Hepatic Neg liver ROS, GERD  ,  Endo/Other  negative endocrine ROSdiabetes  Renal/GU negative Renal ROS  negative genitourinary   Musculoskeletal negative musculoskeletal ROS (+)   Abdominal   Peds negative pediatric ROS (+)  Hematology negative hematology ROS (+)   Anesthesia Other Findings   Reproductive/Obstetrics negative OB ROS                             Anesthesia Physical Anesthesia Plan  ASA: III  Anesthesia Plan: Bier Block and Bier Block-LIDOCAINE ONLY   Post-op Pain Management:    Induction: Intravenous  PONV Risk Score and Plan: 3 and Ondansetron, Dexamethasone, Midazolam and Treatment may vary due to age or medical condition  Airway Management Planned: Simple Face Mask  Additional Equipment:   Intra-op Plan:   Post-operative Plan:   Informed Consent: I have reviewed the patients History and Physical, chart, labs and discussed the procedure including the risks, benefits and alternatives for the proposed anesthesia with the patient or authorized representative who has indicated his/her understanding and acceptance.     Dental advisory given  Plan Discussed with: CRNA  Anesthesia Plan Comments:         Anesthesia Quick Evaluation

## 2019-04-25 ENCOUNTER — Encounter: Payer: Self-pay | Admitting: *Deleted

## 2019-05-20 ENCOUNTER — Encounter: Payer: Self-pay | Admitting: Internal Medicine

## 2019-05-20 ENCOUNTER — Other Ambulatory Visit: Payer: Self-pay

## 2019-05-20 ENCOUNTER — Ambulatory Visit (INDEPENDENT_AMBULATORY_CARE_PROVIDER_SITE_OTHER): Payer: Medicare Other | Admitting: Internal Medicine

## 2019-05-20 DIAGNOSIS — H60501 Unspecified acute noninfective otitis externa, right ear: Secondary | ICD-10-CM | POA: Diagnosis not present

## 2019-05-20 DIAGNOSIS — J449 Chronic obstructive pulmonary disease, unspecified: Secondary | ICD-10-CM

## 2019-05-20 DIAGNOSIS — J3089 Other allergic rhinitis: Secondary | ICD-10-CM

## 2019-05-20 DIAGNOSIS — J302 Other seasonal allergic rhinitis: Secondary | ICD-10-CM

## 2019-05-20 NOTE — Assessment & Plan Note (Signed)
She is complaining again of R ear pain despite decongestants and previous augmentin. Plan- I asked her to contact Dr Redmond Baseman ENT, whom she has seen before.

## 2019-05-20 NOTE — Assessment & Plan Note (Signed)
Now better controlled at mild intermittent level. Ok to continue current management with occasional rescue inhaler.

## 2019-05-20 NOTE — Patient Instructions (Signed)
I'm glad your asthma has been better.  We can refill your rescue inhaler when needed.  Current meds for allergic rhinitis seem to be satisfactory.  I suggest you call Dr Redmond Baseman ENT and ask about being seen for your ear discomfort.    Please call if we can help

## 2019-05-20 NOTE — Assessment & Plan Note (Signed)
Seasonal pattern. Ok for prn med use.

## 2019-05-20 NOTE — Progress Notes (Signed)
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 Allergy Profile-09/10/2013-negative-total IgE 12.5 with no specific elevations PFT 11/25/13-within normal limits with insignificant response to bronchodilator Triggers for asthma include strong odors, seasonal pollens, sinus infections, and the room at work which she goes into intermittently- experiencing hoarseness and cough. FENO 01/02/17- 12 ( not elevated) Office Spirometry 8/128/18- WNL-FVC 2.79/106%, FEV1 2.13/112%, ratio 0.77, FEF 25-75% 1.95/129% -----------------------------------------------------------------------------------  11/13/2018- Virtual Visit via Telephone Note  History of Present Illness: 69 year old female never smoker followed for Asthma, allergic rhinitis, recurrent acute maxillary sinusitis complicated by GERD, aortic regurgitation, DM2 -----recommended a couple years ago to stop Advair; weaned off had not had any since May, also stopped Singulair; has had hx of depression that she takes medication for (saw Singulair had blackbox on depression), still doing flonase bid Notices mainly sinus congestion. Breathing ok off Advair and Singulair as we suggested, since May 1. Occ cough- white. Rarely needs albuterol rescue.  Using Flonase twice daily for now. Notes R ear pain. No return with saline nasal rinse. Occ sudafed, zyrtec.  Observations/Objective: Assessment and Plan: Rhinitis, possible otitis externa- ok to take her augmentin[place] Asthma- mild well controlled, uncomplicated  Follow Up Instructions: 6 months    05/20/19- Virtual Visit via Telephone Note  I connected with Debbie Norris on 05/20/19 at  9:00 AM EST by telephone and verified that I am speaking with the correct person using two identifiers.  Location: Patient: H Provider:  O   I discussed the limitations, risks, security and privacy concerns of performing an evaluation and management service by telephone and the availability of in person appointments. I also discussed with the patient that there may be a patient responsible charge related to this service. The patient expressed understanding and agreed to proceed.   History of Present Illness: 69 year old female never smoker followed for Asthma, allergic rhinitis, recurrent acute maxillary sinusitis complicated by GERD, aortic regurgitation, DM2, depression, Albuterol hfa, zyrtec, flonase Currently in [place] but still spends a lot of time at [place] well off maintenance inhalers. Used rescue once a few weeks ago. Rhinitis ok now,after augmentin for sinus infection per PCP in December.  Using Sudafed, Flonase, Robitussin CF as needed. Persistent pressure/ soreness R ear. Has seen Dr Redmond Baseman ENT before.  Observations/Objective:   Assessment and Plan: Asthma- now mild intermittent uncomplicated Allergic Rhinitis- seasonal Otitis- persistent despite augmentin, Sudafed. I recommended she see her ENT.  Follow Up Instructions: 1 year   I discussed the assessment and treatment plan with the patient. The patient was provided an opportunity to ask questions and all were answered. The patient agreed with the plan and demonstrated an understanding of the instructions.   The patient was advised to call back or seek an in-person evaluation if the symptoms worsen or if the condition fails to improve as anticipated.  I provided 21 minutes of non-face-to-face time during this encounter.   Baird Lyons, MD     ROS-see HPI    + = positive Constitutional:   No-   weight loss, night sweats, fevers, chills, fatigue, lassitude. HEENT:   No-  headaches, +difficulty swallowing, tooth/dental problems, sore throat,       Sneezing, itching, +ear ache, +nasal congestion, post nasal drip,  CV:  No-   chest pain,  orthopnea, PND, swelling in lower extremities, anasarca,  dizziness, palpitations Resp: no-shortness of breath with exertion or at rest.              productive cough, non-productive cough,  No- coughing up of blood.              No-   change in color of mucus.  No- wheezing.   Skin: No-   rash or lesions. GI:  No-heartburn, indigestion, abdominal pain, nausea, vomiting, GU:  MS:  +  joint pain or swelling.  No- decreased range of motion.  No- back pain. Neuro-     nothing unusual Psych:  No- change in mood or affect. + depression or anxiety.  No memory loss.  OBJ- Physical Exam General- Alert, Oriented, Affect-appropriate, Distress- none acute Skin- rash-none, lesions- none, excoriation- none Lymphadenopathy- none Head- atraumatic            Eyes- Gross vision intact, PERRLA, conjunctivae and secretions clear            Ears- Hearing, canals-normal            Nose- mucosa normal, no-Septal dev, +, no-polyps, erosion, perforation             Throat- Mallampati II , mucosa clear, drainage- none, tonsils- atrophic Neck- flexible , trachea midline, no stridor , thyroid nl, carotid no bruit Chest - symmetrical excursion , unlabored           Heart/CV- RRR ,  Murmur +1/6 syst A.I. , no gallop  , no rub, nl s1 s2                           - JVD- none , edema- none, stasis changes- none, varices- none           Lung- clear to P&A, wheeze- none, cough+slight , dullness-none, rub- none           Chest wall-  Abd-  Br/ Gen/ Rectal- Not done, not indicated Extrem- cyanosis- none, clubbing, none, atrophy- none, strength- nl Neuro- grossly intact to observation

## 2019-06-04 ENCOUNTER — Ambulatory Visit: Payer: Medicare Other

## 2019-06-13 ENCOUNTER — Ambulatory Visit: Payer: Medicare Other | Attending: Internal Medicine

## 2019-06-13 DIAGNOSIS — Z23 Encounter for immunization: Secondary | ICD-10-CM

## 2019-06-13 NOTE — Progress Notes (Signed)
   Covid-19 Vaccination Clinic  Name:  Debbie Norris    MRN: [id number] DOB: 01-02-1951  06/13/2019  Ms. [name] was observed post Covid-19 immunization for 15 minutes without incidence. She was provided with Vaccine Information Sheet and instruction to access the V-Safe system.   Ms. [name] was instructed to call 911 with any severe reactions post vaccine: Marland Kitchen Difficulty breathing  . Swelling of your face and throat  . A fast heartbeat  . A bad rash all over your body  . Dizziness and weakness    Immunizations Administered    Name Date Dose VIS Date Route   Pfizer COVID-19 Vaccine 06/13/2019  9:00 AM 0.3 mL 04/18/2019 Intramuscular   Manufacturer: [place]   Lot: [id number]   [place]: [id number]

## 2019-06-18 ENCOUNTER — Other Ambulatory Visit: Payer: Self-pay

## 2019-06-18 ENCOUNTER — Encounter: Payer: Self-pay | Admitting: Cardiology

## 2019-06-18 ENCOUNTER — Ambulatory Visit (INDEPENDENT_AMBULATORY_CARE_PROVIDER_SITE_OTHER): Payer: Medicare Other | Admitting: Cardiology

## 2019-06-18 VITALS — BP 120/70 | HR 70 | Ht 62.0 in | Wt 168.2 lb

## 2019-06-18 DIAGNOSIS — I4510 Unspecified right bundle-branch block: Secondary | ICD-10-CM

## 2019-06-18 DIAGNOSIS — M791 Myalgia, unspecified site: Secondary | ICD-10-CM

## 2019-06-18 DIAGNOSIS — I351 Nonrheumatic aortic (valve) insufficiency: Secondary | ICD-10-CM | POA: Diagnosis not present

## 2019-06-18 DIAGNOSIS — I493 Ventricular premature depolarization: Secondary | ICD-10-CM | POA: Diagnosis not present

## 2019-06-18 DIAGNOSIS — I444 Left anterior fascicular block: Secondary | ICD-10-CM

## 2019-06-18 DIAGNOSIS — Z789 Other specified health status: Secondary | ICD-10-CM

## 2019-06-18 MED ORDER — ROSUVASTATIN CALCIUM 5 MG PO TABS: 5.0000 mg | ORAL_TABLET | Freq: Every day | ORAL | 3 refills | Status: DC

## 2019-06-18 NOTE — Patient Instructions (Signed)
Medication Instructions:  Please hold your Crestor 5 mg for 2 weeks.  Restart Crestor 5 mg daily in 2 weeks.  Continue all other medications as listed.   *If you need a refill on your cardiac medications before your next appointment, please call your pharmacy*  Follow-Up: At [facility], you and your health needs are our priority.  As part of our continuing mission to provide you with exceptional heart care, we have created designated Provider Care Teams.  These Care Teams include your primary Cardiologist (physician) and Advanced Practice Providers (APPs -  Physician Assistants and Nurse Practitioners) who all work together to provide you with the care you need, when you need it.  Your next appointment:   4 week(s)  The format for your next appointment:   Virtual Visit   Provider:   You may see Candee Furbish, MD or one of the following Advanced Practice Providers on your designated Care Team:    Truitt Merle, NP  Cecilie Kicks, NP  Kathyrn Drown, NP   Thank you for choosing [facility]!!

## 2019-06-18 NOTE — Progress Notes (Signed)
Cardiology Office Note:    Date:  06/18/2019   ID:  REANNA SCOGGIN, DOB 1951-02-05, MRN [id number]  PCP:  Maurice Small, MD  Cardiologist:  Candee Furbish, MD  Electrophysiologist:  None   Referring MD: Maurice Small, MD     History of Present Illness:    Debbie Norris is a 69 y.o. female here for follow-up of aortic regurgitation.  Retired Agricultural consultant.  Has diabetes but allergic to Metformin.  Has gone through several statins, Livalo caused cramps atorvastatin pravastatin simvastatin.  See below for details.  Mother at age 59 had myocardial infarction.  She had a gastric bypass in 2006.      Past Medical History:  Diagnosis Date   Abdominal pain    Asthma    Atrophic vaginitis    Cancer ([place])    basel cell   Complication of anesthesia    Depression    Diabetes ([place])    High blood pressure    Hx of colonic polyps    Hyperlipidemia    Obesity    GASTRIC BYPASS IN 2006   PONV (postoperative nausea and vomiting)    with inhaled anesthetic    Vitamin D deficiency     Past Surgical History:  Procedure Laterality Date   ABDOMINAL HYSTERECTOMY     BREAST CYST ASPIRATION Right unsure   pt states 30+ years ago   COLONOSCOPY     FACIAL COSMETIC SURGERY     GASTRIC BYPASS     NASAL SINUS SURGERY     TRIGGER FINGER RELEASE Right 04/24/2019   Procedure: RELEASE TRIGGER FINGER/A-1 PULLEY RIGHT INDEX FINGER;  Surgeon: Leanora Cover, MD;  Location: [facility];  Service: Orthopedics;  Laterality: Right;    Current Medications: Current Meds  Medication Sig   albuterol (PROVENTIL HFA) 108 (90 Base) MCG/ACT inhaler TAKE 2 PUFFS BY MOUTH EVERY 4 TO 6 HOURS AS NEEDED   ALPRAZolam (XANAX) 0.5 MG tablet Take 0.5 mg by mouth daily as needed for anxiety.   Calcium Carbonate Antacid (TUMS PO) Take 1,000 mg by mouth 3 (three) times daily.   cetirizine (ZYRTEC) 10 MG chewable tablet Chew 10 mg by mouth daily.   Coenzyme Q10 (Q-10  CO-ENZYME PO) Take 300 mg by mouth daily.   cyclobenzaprine (FLEXERIL) 10 MG tablet Take 10 mg by mouth every 8 (eight) hours as needed.   diclofenac sodium (VOLTAREN) 1 % GEL As directed   Ferrous Sulfate (SLOW FE PO) Take by mouth.   FLUoxetine (PROZAC) 40 MG capsule Take 40 mg by mouth daily.   fluticasone (FLONASE) 50 MCG/ACT nasal spray Place 2 sprays into both nostrils daily.   glipiZIDE (GLUCOTROL XL) 5 MG 24 hr tablet Take 2.5 mg by mouth daily.    ONE TOUCH ULTRA TEST test strip 1 each by Other route as needed for other.    pantoprazole (PROTONIX) 40 MG tablet Take 40 mg by mouth 2 (two) times daily at 10 am and 4 pm.    tolterodine (DETROL LA) 4 MG 24 hr capsule Take 1 capsule by mouth daily.    VAGIFEM 10 MCG TABS vaginal tablet Place 1 tablet vaginally 2 (two) times a week.    vitamin B-12 (CYANOCOBALAMIN) 1000 MCG tablet Take 1,000 mcg by mouth daily.   Vitamin D, Ergocalciferol, (DRISDOL) 50000 UNITS CAPS capsule Take 50,000 Units by mouth daily. Sundays   zolpidem (AMBIEN) 10 MG tablet Take 10 mg by mouth at bedtime as needed for sleep.    [  DISCONTINUED] rosuvastatin (CRESTOR) 5 MG tablet Take 5 mg by mouth. Twice a week(Sundays and Wednesdays)     Allergies:   Cream base, Pineapple, Toradol [ketorolac tromethamine], Sevoflurane, Bextra [valdecoxib], Lipitor [atorvastatin], Metformin and related, Other, Phenergan [promethazine hcl], and Prinivil [lisinopril]   Social History   Socioeconomic History   Marital status: Married    Spouse name: Not on file   Number of children: Not on file   Years of education: Not on file   Highest education level: Not on file  Occupational History   Not on file  Tobacco Use   Smoking status: Never Smoker   Smokeless tobacco: Never Used  Substance and Sexual Activity   Alcohol use: Yes    Comment: social   Drug use: No   Sexual activity: Not on file  Other Topics Concern   Not on file  Social History  Narrative   Not on file   Social Determinants of Health   Financial Resource Strain:    Difficulty of Paying Living Expenses: Not on file  Food Insecurity:    Worried About Running Out of Food in the Last Year: Not on file   Ran Out of Food in the Last Year: Not on file  Transportation Needs:    Lack of Transportation (Medical): Not on file   Lack of Transportation (Non-Medical): Not on file  Physical Activity:    Days of Exercise per Week: Not on file   Minutes of Exercise per Session: Not on file  Stress:    Feeling of Stress : Not on file  Social Connections:    Frequency of Communication with Friends and Family: Not on file   Frequency of Social Gatherings with Friends and Family: Not on file   Attends Religious Services: Not on file   Active Member of Clubs or Organizations: Not on file   Attends Archivist Meetings: Not on file   Marital Status: Not on file     Family History: The patient's family history includes Cancer in her father; Emphysema in her paternal aunt; Heart disease in her father and mother.  ROS:   Please see the history of present illness.     All other systems reviewed and are negative.  EKGs/Labs/Other Studies Reviewed:    The following studies were reviewed today:  Echo 2017-mild aortic regurgitation mild mitral regurgitation Echo 2020-normal EF moderate MAC trivial regurgitation, no significant aortic regurgitation reported.  EKG:  EKG is  ordered today.  The ekg ordered today demonstrates 06/18/2019-sinus rhythm 70 right bundle branch block normal sinus rhythm left anterior fascicular block showed right bundle branch block left anterior fascicular block possible lateral infarct pattern sinus rhythm  Recent Labs: 04/21/2019: BUN 16; Creatinine, Ser 1.18; Potassium 4.9; Sodium 139  Recent Lipid Panel No results found for: CHOL, TRIG, HDL, CHOLHDL, VLDL, LDLCALC, LDLDIRECT  Physical Exam:    VS:  BP 120/70    Pulse 70     Ht _0  (1.575 m)    Wt 168 lb 3.2 oz (76.3 kg)    SpO2 97%    BMI 30.76 kg/m     Wt Readings from Last 3 Encounters:  06/18/19 168 lb 3.2 oz (76.3 kg)  04/24/19 168 lb 10.4 oz (76.5 kg)  06/18/18 165 lb 6.4 oz (75 kg)     GEN:  Well nourished, well developed in no acute distress HEENT: Normal NECK: No JVD; No carotid bruits LYMPHATICS: No lymphadenopathy CARDIAC: RRR, no murmurs, rubs, gallops RESPIRATORY:  Clear to auscultation without rales, wheezing or rhonchi  ABDOMEN: Soft, non-tender, non-distended MUSCULOSKELETAL:  No edema; No deformity  SKIN: Warm and dry NEUROLOGIC:  Alert and oriented x 3 PSYCHIATRIC:  Normal affect   ASSESSMENT:    1. PVC (premature ventricular contraction)   2. Left anterior fascicular block   3. Right bundle branch block   4. Nonrheumatic aortic valve insufficiency   5. Statin intolerance   6. Myalgia    PLAN:    In order of problems listed above:  Mild mitral and aortic regurgitation -In fact last echocardiogram in 2020 demonstrated only trivial mitral regurgitation and did not comment on aortic regurgitation.  Overall been doing quite well.  No symptoms.  PVCs -None seen on exam today.  Try to avoid Sudafed caffeine.  Right bundle branch block/left anterior fascicular block/bifascicular block -Overall doing quite well, no syncope no shortness of breath.  May ultimately need a pacemaker in the future but for right now doing great.  Prior statin intolerance -Several agents as above were not tolerated.  Zetia was not tolerated because of diarrhea. Tried Crestor 5 twice a week.LDL 121 in June 2020  Day after take it legs ache. Sun and Wed.  I asked her to take a Crestor holiday for 10 to 14 days.  Reevaluate her leg discomfort.  Try restarting it once a day.  See how she feels.  In 1 month, have a virtual visit with APP to see how her symptoms are.  If she is doing well.  Continue to prescribe once a day Crestor 5 mg.  She has gone through  several different statins.  SOB - off advair and sigulair.  Doing well.  Medication Adjustments/Labs and Tests Ordered: Current medicines are reviewed at length with the patient today.  Concerns regarding medicines are outlined above.  Orders Placed This Encounter  Procedures   EKG 12-Lead   Meds ordered this encounter  Medications   rosuvastatin (CRESTOR) 5 MG tablet    Sig: Take 1 tablet (5 mg total) by mouth daily.    Dispense:  90 tablet    Refill:  3    Patient Instructions  Medication Instructions:  Please hold your Crestor 5 mg for 2 weeks.  Restart Crestor 5 mg daily in 2 weeks.  Continue all other medications as listed.   *If you need a refill on your cardiac medications before your next appointment, please call your pharmacy*  Follow-Up: At [facility], you and your health needs are our priority.  As part of our continuing mission to provide you with exceptional heart care, we have created designated Provider Care Teams.  These Care Teams include your primary Cardiologist (physician) and Advanced Practice Providers (APPs -  Physician Assistants and Nurse Practitioners) who all work together to provide you with the care you need, when you need it.  Your next appointment:   4 week(s)  The format for your next appointment:   Virtual Visit   Provider:   You may see Candee Furbish, MD or one of the following Advanced Practice Providers on your designated Care Team:    Truitt Merle, NP  Cecilie Kicks, NP  Kathyrn Drown, NP   Thank you for choosing [facility]!!         Signed, Candee Furbish, MD  06/18/2019 9:51 AM    [place]

## 2019-06-25 ENCOUNTER — Ambulatory Visit: Payer: Medicare Other

## 2019-07-08 ENCOUNTER — Ambulatory Visit: Payer: Medicare Other | Attending: Internal Medicine

## 2019-07-08 DIAGNOSIS — Z23 Encounter for immunization: Secondary | ICD-10-CM | POA: Insufficient documentation

## 2019-07-08 NOTE — Progress Notes (Signed)
   Covid-19 Vaccination Clinic  Name:  Debbie Norris    MRN: [id number] DOB: 1951/01/15  07/08/2019  [name] was observed post Covid-19 immunization for 15 minutes without incident. She was provided with Vaccine Information Sheet and instruction to access the V-Safe system.   [name] was instructed to call 911 with any severe reactions post vaccine: Marland Kitchen Difficulty breathing  . Swelling of face and throat  . A fast heartbeat  . A bad rash all over body  . Dizziness and weakness   Immunizations Administered    Name Date Dose VIS Date Route   Pfizer COVID-19 Vaccine 07/08/2019  9:45 AM 0.3 mL 04/18/2019 Intramuscular   Manufacturer: [place]   Lot: [id number]   Power: [id number]

## 2019-07-13 NOTE — Progress Notes (Signed)
Virtual Visit via Telephone Note   This visit type was conducted due to national recommendations for restrictions regarding the COVID-19 Pandemic (e.g. social distancing) in an effort to limit this patient's exposure and mitigate transmission in our community.  Due to her co-morbid illnesses, this patient is at least at moderate risk for complications without adequate follow up.  This format is felt to be most appropriate for this patient at this time.  The patient did not have access to video technology/had technical difficulties with video requiring transitioning to audio format only (telephone).  All issues noted in this document were discussed and addressed.  No physical exam could be performed with this format.  Please refer to the patient's chart for her  consent to telehealth for [facility].   The patient was identified using 2 identifiers.  Date:  07/17/2019   ID:  Debbie Norris, DOB [birth year], MRN [id number]  Patient Location: Home Provider Location: Office  PCP:  Maurice Small, MD  Cardiologist:  Candee Furbish, MD  Electrophysiologist:  None   Evaluation Performed:  Follow-Up Visit  Chief Complaint:  Follow up for Crestor restart  History of Present Illness:    Debbie Norris is a 69 y.o. female who presents for 1 month follow-up after Crestor initiation, seen for Dr. Marlou Porch.  Debbie Norris has a history of mild mitral and aortic regurgitation per echocardiogram 2020, PVCs, RBBB/left anterior fascicular block/bifascicular block, DM2, HLD with known statin intolerance.   Has tried Livalo, atorvastatin, pravastatin and simvastatin Zetia  Family history of MI in mother at age 38.  Also has a history of gastric bypass surgery in 2006.  She was last seen by Dr. Marlou Porch at which time she was having issues with elevated LDL and statin intolerances.  Noted to have tried Crestor 5 mg twice a week with an LDL of 120 05/13/2018.  Soon after she had leg aches therefore Dr.  Marlou Porch asked for her to take Crestor holiday for 2 weeks and reevaluate her leg discomfort. At last office visit, plan was to attempt to restart Crestor once a day with close reevaluation of her symptoms.   Today, Debbie Norris reports that she has no symptoms similar to prior statin intolerance such as severe leg cramping.  Does report bilateral leg weakness however no pain.  Is interested in continuing Crestor daily at this time.  She is concerned about her kidney function.  Unfortunately, [place] at the beach and will be there for the next month.  Spoke with Debroah Baller, PA at [facility] who will draw lab work for her therefore we will fax an order for BMET.  Discussed possible referral to lipid clinic for further discussion regarding PCSK9 inhibitors.  Patient seems interested at this time.  We will go ahead and set that up.  Has no other symptoms of chest pain, shortness of breath, LE edema, dizziness or syncope.  The patient does not have symptoms concerning for COVID-19 infection (fever, chills, cough, or new shortness of breath).    Past Medical History:  Diagnosis Date  . Abdominal pain   . Asthma   . Atrophic vaginitis   . Cancer ([place])    basel cell  . Complication of anesthesia   . Depression   . Diabetes ([place])   . High blood pressure   . Hx of colonic polyps   . Hyperlipidemia   . Obesity    GASTRIC BYPASS IN 2006  . PONV (postoperative  nausea and vomiting)    with inhaled anesthetic   . Vitamin D deficiency    Past Surgical History:  Procedure Laterality Date  . ABDOMINAL HYSTERECTOMY    . BREAST CYST ASPIRATION Right unsure   pt states 30+ years ago  . COLONOSCOPY    . FACIAL COSMETIC SURGERY    . GASTRIC BYPASS    . NASAL SINUS SURGERY    . TRIGGER FINGER RELEASE Right 04/24/2019   Procedure: RELEASE TRIGGER FINGER/A-1 PULLEY RIGHT INDEX FINGER;  Surgeon: Leanora Cover, MD;  Location: [facility];  Service: Orthopedics;  Laterality:  Right;     Current Meds  Medication Sig  . albuterol (PROVENTIL HFA) 108 (90 Base) MCG/ACT inhaler TAKE 2 PUFFS BY MOUTH EVERY 4 TO 6 HOURS AS NEEDED  . ALPRAZolam (XANAX) 0.5 MG tablet Take 0.5 mg by mouth daily as needed for anxiety.  . Calcium Carbonate Antacid (TUMS PO) Take 1,000 mg by mouth 3 (three) times daily.  . cetirizine (ZYRTEC) 10 MG chewable tablet Chew 10 mg by mouth daily.  . Coenzyme Q10 (Q-10 CO-ENZYME PO) Take 300 mg by mouth daily.  . cyclobenzaprine (FLEXERIL) 10 MG tablet Take 10 mg by mouth every 8 (eight) hours as needed.  . diclofenac sodium (VOLTAREN) 1 % GEL As directed  . Ferrous Sulfate (SLOW FE PO) Take by mouth.  Marland Kitchen FLUoxetine (PROZAC) 40 MG capsule Take 40 mg by mouth daily.  . fluticasone (FLONASE) 50 MCG/ACT nasal spray Place 2 sprays into both nostrils 2 (two) times daily.   Marland Kitchen glipiZIDE (GLUCOTROL XL) 5 MG 24 hr tablet Take 2.5 mg by mouth daily.   . ONE TOUCH ULTRA TEST test strip 1 each by Other route as needed for other.   . pantoprazole (PROTONIX) 40 MG tablet Take 40 mg by mouth 2 (two) times daily at 10 am and 4 pm.   . rosuvastatin (CRESTOR) 5 MG tablet Take 1 tablet (5 mg total) by mouth daily.  Marland Kitchen tolterodine (DETROL LA) 4 MG 24 hr capsule Take 1 capsule by mouth daily.   Marland Kitchen VAGIFEM 10 MCG TABS vaginal tablet Place 1 tablet vaginally 2 (two) times a week.   . vitamin B-12 (CYANOCOBALAMIN) 1000 MCG tablet Take 1,000 mcg by mouth daily.  . Vitamin D, Ergocalciferol, (DRISDOL) 50000 UNITS CAPS capsule Take 50,000 Units by mouth daily. Sundays  . zolpidem (AMBIEN) 10 MG tablet Take 10 mg by mouth at bedtime as needed for sleep.      Allergies:   Cream base, Pineapple, Toradol [ketorolac tromethamine], Sevoflurane, Bextra [valdecoxib], Lipitor [atorvastatin], Metformin and related, Other, Phenergan [promethazine hcl], and Prinivil [lisinopril]   Social History   Tobacco Use  . Smoking status: Never Smoker  . Smokeless tobacco: Never Used    Substance Use Topics  . Alcohol use: Yes    Comment: social  . Drug use: No     Family Hx: The patient's family history includes Cancer in her father; Emphysema in her paternal aunt; Heart disease in her father and mother.  ROS:   Please see the history of present illness.     All other systems reviewed and are negative.  Prior CV studies:   The following studies were reviewed today:  Echocardiogram 06/21/2018:  1. The left ventricle has normal systolic function with an ejection  fraction of 60-65%. The cavity size was normal. There is mildly increased  left ventricular wall thickness. Left ventricular diastolic Doppler  parameters are consistent with impaired  relaxation  No evidence of left ventricular regional wall motion  abnormalities.  2. The right ventricle has normal systolic function. The cavity was  normal. There is no increase in right ventricular wall thickness.  3. The mitral valve is normal in structure. There is moderate mitral  annular calcification present. No evidence of mitral valve stenosis.  Trivial regurgitation.  4. The tricuspid valve is normal in structure.  5. The aortic valve is tricuspid Mild calcification of the aortic valve.  no stenosis of the aortic valve.  6. The pulmonic valve was normal in structure.  7. Right atrial pressure is estimated at 3 mmHg.  8. No complete TR doppler jet so unable to estimate PA systolic pressure.   Labs/Other Tests and Data Reviewed:    EKG:  No ECG reviewed.  Recent Labs: 04/21/2019: BUN 16; Creatinine, Ser 1.18; Potassium 4.9; Sodium 139   Recent Lipid Panel No results found for: CHOL, TRIG, HDL, CHOLHDL, LDLCALC, LDLDIRECT  Wt Readings from Last 3 Encounters:  07/17/19 167 lb 3.2 oz (75.8 kg)  06/18/19 168 lb 3.2 oz (76.3 kg)  04/24/19 168 lb 10.4 oz (76.5 kg)     Objective:    Vital Signs:  BP 128/71   Pulse 76   Ht 5\' 2"  (1.575 m)   Wt 167 lb 3.2 oz (75.8 kg)   BMI 30.58 kg/m     VITAL SIGNS:  reviewed GEN:  no acute distress NEURO:  alert and oriented x 3, no obvious focal deficit PSYCH:  normal affect  ASSESSMENT & PLAN:    1.  Known statin intolerance: -Last LDL noted to be elevated at 120 05/13/2018 -Multiple statin intolerances noted as above -Plan was to restart Crestor with close follow-up -Reports leg weakness however no leg pain. Would like to continue Crestor at this time with interest in pursing lipid clinic referral for possible PCSK9  -Has concern about kidney function however is currently at her beach house therefore will fax order to [facility] to obtain lab work  2.  Mild mitral and aortic regurgitation: -Per echocardiogram 2020 with trivial mitral regurgitation -No symptoms -Continue to monitor with serial echocardiograms if symptoms occur  3.  History of PVCs: -Stable, no c/o palpitations   4.  RBBB/left anterior fascicular block/bifascicular block: -No syncope, shortness of breath -Felt to ultimately require PPM placement however not needed at the current time   COVID-19 Education: The signs and symptoms of COVID-19 were discussed with the patient and how to seek care for testing (follow up with PCP or arrange E-visit). The importance of social distancing was discussed today.  Time:   Today, I have spent 20 minutes with the patient with telehealth technology discussing the above problems.     Medication Adjustments/Labs and Tests Ordered: Current medicines are reviewed at length with the patient today.  Concerns regarding medicines are outlined above.   Tests Ordered: Orders Placed This Encounter  Procedures  . AMB Referral to Advanced Lipid Disorders Clinic    Medication Changes: No orders of the defined types were placed in this encounter.   Follow Up:  Either In Person or Virtual Dr. Marlou Porch in 6 months  Signed, Kathyrn Drown, NP  07/17/2019 2:17 PM    [place]

## 2019-07-17 ENCOUNTER — Telehealth (INDEPENDENT_AMBULATORY_CARE_PROVIDER_SITE_OTHER): Payer: Medicare Other | Admitting: Cardiology

## 2019-07-17 ENCOUNTER — Other Ambulatory Visit: Payer: Self-pay

## 2019-07-17 ENCOUNTER — Encounter: Payer: Self-pay | Admitting: Cardiology

## 2019-07-17 VITALS — BP 128/71 | HR 76 | Ht 62.0 in | Wt 167.2 lb

## 2019-07-17 DIAGNOSIS — E7841 Elevated Lipoprotein(a): Secondary | ICD-10-CM

## 2019-07-17 DIAGNOSIS — I080 Rheumatic disorders of both mitral and aortic valves: Secondary | ICD-10-CM | POA: Diagnosis not present

## 2019-07-17 DIAGNOSIS — Z789 Other specified health status: Secondary | ICD-10-CM

## 2019-07-17 DIAGNOSIS — I493 Ventricular premature depolarization: Secondary | ICD-10-CM

## 2019-07-17 DIAGNOSIS — Z888 Allergy status to other drugs, medicaments and biological substances status: Secondary | ICD-10-CM

## 2019-07-17 DIAGNOSIS — I452 Bifascicular block: Secondary | ICD-10-CM

## 2019-07-17 DIAGNOSIS — Z8679 Personal history of other diseases of the circulatory system: Secondary | ICD-10-CM

## 2019-07-17 NOTE — Patient Instructions (Addendum)
  Medication Instructions:   Your physician recommends that you continue on your current medications as directed. Please refer to the Current Medication list given to you today.  *If you need a refill on your cardiac medications before your next appointment, please call your pharmacy*  Lab Work:  None ordered today  If you have labs (blood work) drawn today and your tests are completely normal, you will receive your results only by: Marland Kitchen MyChart Message (if you have MyChart) OR . A paper copy in the mail If you have any lab test that is abnormal or we need to change your treatment, we will call you to review the results.  Testing/Procedures:  None ordered today  Follow-Up: At [facility], you and your health needs are our priority.  As part of our continuing mission to provide you with exceptional heart care, we have created designated Provider Care Teams.  These Care Teams include your primary Cardiologist (physician) and Advanced Practice Providers (APPs -  Physician Assistants and Nurse Practitioners) who all work together to provide you with the care you need, when you need it.  We recommend signing up for the patient portal called "MyChart".  Sign up information is provided on this After Visit Summary.  MyChart is used to connect with patients for Virtual Visits (Telemedicine).  Patients are able to view lab/test results, encounter notes, upcoming appointments, etc.  Non-urgent messages can be sent to your provider as well.   To learn more about what you can do with MyChart, go to [email or web address].    Your next appointment:   6 month(s)  The format for your next appointment:   In Person  Provider:   Candee Furbish, MD   Other Instructions  Referral to lipid clinic started, they will contact you to set up the appointment.

## 2019-08-04 ENCOUNTER — Telehealth: Payer: Self-pay | Admitting: Cardiology

## 2019-08-04 NOTE — Telephone Encounter (Signed)
Debbie Norris is calling requesting a nurse call her to give the results of her labs by 12:00 PM today if at all possible. She states if the numbers are not elevated she will cancel her appointment for tomorrow due to it being a 4 hour drive. Please advise.

## 2019-08-04 NOTE — Telephone Encounter (Signed)
The patient has been notified of the result and verbalized understanding.  All questions (if any) were answered. Lab work reviewed from PCP and patient wanted those results. Patient will cancel appointment with lipid clinic tomorrow since she is 4 hours away, patient will call to reschedule. Mady Haagensen, [place] 08/04/2019 11:23 AM

## 2019-08-05 ENCOUNTER — Ambulatory Visit: Payer: Medicare Other

## 2020-01-07 ENCOUNTER — Other Ambulatory Visit: Payer: Self-pay

## 2020-01-07 ENCOUNTER — Ambulatory Visit (INDEPENDENT_AMBULATORY_CARE_PROVIDER_SITE_OTHER): Payer: Medicare Other | Admitting: Internal Medicine

## 2020-01-07 ENCOUNTER — Encounter: Payer: Self-pay | Admitting: Internal Medicine

## 2020-01-07 DIAGNOSIS — J0101 Acute recurrent maxillary sinusitis: Secondary | ICD-10-CM | POA: Diagnosis not present

## 2020-01-07 DIAGNOSIS — J449 Chronic obstructive pulmonary disease, unspecified: Secondary | ICD-10-CM | POA: Diagnosis not present

## 2020-01-07 MED ORDER — AMOXICILLIN-POT CLAVULANATE 875-125 MG PO TABS: 1.0000 | ORAL_TABLET | Freq: Two times a day (BID) | ORAL | 0 refills | Status: DC

## 2020-01-07 NOTE — Patient Instructions (Signed)
It was a pleasure seeing you today.  Augmentin 875mg , take one tablet twice a day for seven days.  OK to use Muscinex as discussed.  OK to use inhaler as discussed.  Please return to our office in one year for a follow up visit.

## 2020-01-07 NOTE — Assessment & Plan Note (Signed)
Currently comfortable. Mucinex seems to help- discussed.
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
## 2020-01-07 NOTE — Assessment & Plan Note (Signed)
Mild persistant uncomplicated chronic bronchitis pattern with some loose cough today on deep breath, but otherwise clear chest. Plan- ok to keep inhalers available, augmentin sent to drug store to hold for trip Caution to be careful with [place] smoke

## 2020-04-20 ENCOUNTER — Other Ambulatory Visit: Payer: Self-pay | Admitting: Family Medicine

## 2020-04-20 DIAGNOSIS — Z1231 Encounter for screening mammogram for malignant neoplasm of breast: Secondary | ICD-10-CM

## 2020-04-22 ENCOUNTER — Ambulatory Visit
Admission: RE | Admit: 2020-04-22 | Discharge: 2020-04-22 | Disposition: A | Discharge status: Home / Self Care | Payer: Medicare Other | Source: Ambulatory Visit | Attending: Family Medicine | Admitting: Family Medicine

## 2020-04-22 ENCOUNTER — Other Ambulatory Visit: Payer: Self-pay

## 2020-04-22 DIAGNOSIS — Z1231 Encounter for screening mammogram for malignant neoplasm of breast: Secondary | ICD-10-CM

## 2020-05-09 DIAGNOSIS — M25551 Pain in right hip: Secondary | ICD-10-CM | POA: Diagnosis not present

## 2020-05-09 DIAGNOSIS — M545 Low back pain, unspecified: Secondary | ICD-10-CM | POA: Diagnosis not present

## 2020-05-09 DIAGNOSIS — M25552 Pain in left hip: Secondary | ICD-10-CM | POA: Diagnosis not present

## 2020-05-10 ENCOUNTER — Telehealth: Payer: Self-pay | Admitting: Cardiology

## 2020-05-10 NOTE — Telephone Encounter (Signed)
Patient is calling stating that she usually has an ECHO every year but theres no request in our system. Please advise.

## 2020-05-10 NOTE — Telephone Encounter (Signed)
Jake Bathe, MD  06/24/2018 12:53 PM EST      No significant aortic regurgitation noted during this study. Normal EF. Excellent.  Donato Schultz, MD   Last echo 06/2018 as above.  No notation of when echo is due to be repeated.  Will forward to Dr Anne Fu for his review/orders.

## 2020-05-11 NOTE — Telephone Encounter (Signed)
Based upon last ECHO, ok to skip echo this year. Will likely repeat next year.  Donato Schultz, MD

## 2020-05-11 NOTE — Telephone Encounter (Signed)
Left message for pt on private voicemail - per Dr Anne Fu, echo is not needed this year.  He will determine when she will need another one at her next office visit.  Of note - pt is overdue for f/u with Dr Anne Fu (due 01/2020 with either him or Noreene Larsson).  Requested she call back if questions/concerns.

## 2020-05-19 ENCOUNTER — Ambulatory Visit: Payer: Medicare Other | Admitting: Internal Medicine

## 2020-05-27 ENCOUNTER — Ambulatory Visit: Payer: Medicare Other | Admitting: Internal Medicine

## 2020-06-22 ENCOUNTER — Other Ambulatory Visit: Payer: Self-pay | Admitting: Cardiology

## 2020-06-24 ENCOUNTER — Other Ambulatory Visit: Payer: Self-pay | Admitting: Cardiology

## 2020-06-28 ENCOUNTER — Other Ambulatory Visit: Payer: Self-pay

## 2020-06-28 DIAGNOSIS — H2513 Age-related nuclear cataract, bilateral: Secondary | ICD-10-CM | POA: Diagnosis not present

## 2020-06-28 DIAGNOSIS — H524 Presbyopia: Secondary | ICD-10-CM | POA: Diagnosis not present

## 2020-06-28 DIAGNOSIS — E119 Type 2 diabetes mellitus without complications: Secondary | ICD-10-CM | POA: Diagnosis not present

## 2020-06-28 DIAGNOSIS — H52203 Unspecified astigmatism, bilateral: Secondary | ICD-10-CM | POA: Diagnosis not present

## 2020-06-28 MED ORDER — ROSUVASTATIN CALCIUM 5 MG PO TABS: 5.0000 mg | ORAL_TABLET | Freq: Every day | ORAL | 0 refills | Status: DC

## 2020-07-08 DIAGNOSIS — L578 Other skin changes due to chronic exposure to nonionizing radiation: Secondary | ICD-10-CM | POA: Diagnosis not present

## 2020-07-08 DIAGNOSIS — L814 Other melanin hyperpigmentation: Secondary | ICD-10-CM | POA: Diagnosis not present

## 2020-07-08 DIAGNOSIS — L820 Inflamed seborrheic keratosis: Secondary | ICD-10-CM | POA: Diagnosis not present

## 2020-07-08 DIAGNOSIS — Z85828 Personal history of other malignant neoplasm of skin: Secondary | ICD-10-CM | POA: Diagnosis not present

## 2020-07-08 DIAGNOSIS — L821 Other seborrheic keratosis: Secondary | ICD-10-CM | POA: Diagnosis not present

## 2020-07-08 DIAGNOSIS — D225 Melanocytic nevi of trunk: Secondary | ICD-10-CM | POA: Diagnosis not present

## 2020-07-12 ENCOUNTER — Other Ambulatory Visit: Payer: Self-pay | Admitting: Cardiology

## 2020-07-17 ENCOUNTER — Other Ambulatory Visit: Payer: Self-pay | Admitting: Cardiology

## 2020-08-13 DIAGNOSIS — J069 Acute upper respiratory infection, unspecified: Secondary | ICD-10-CM | POA: Diagnosis not present

## 2020-08-13 DIAGNOSIS — Z03818 Encounter for observation for suspected exposure to other biological agents ruled out: Secondary | ICD-10-CM | POA: Diagnosis not present

## 2020-08-13 DIAGNOSIS — J208 Acute bronchitis due to other specified organisms: Secondary | ICD-10-CM | POA: Diagnosis not present

## 2020-08-13 DIAGNOSIS — R058 Other specified cough: Secondary | ICD-10-CM | POA: Diagnosis not present

## 2020-08-31 DIAGNOSIS — I10 Essential (primary) hypertension: Secondary | ICD-10-CM | POA: Diagnosis not present

## 2020-08-31 DIAGNOSIS — E785 Hyperlipidemia, unspecified: Secondary | ICD-10-CM | POA: Diagnosis not present

## 2020-08-31 DIAGNOSIS — E782 Mixed hyperlipidemia: Secondary | ICD-10-CM | POA: Diagnosis not present

## 2020-08-31 DIAGNOSIS — E1165 Type 2 diabetes mellitus with hyperglycemia: Secondary | ICD-10-CM | POA: Diagnosis not present

## 2020-11-01 ENCOUNTER — Other Ambulatory Visit: Payer: Self-pay | Admitting: *Deleted

## 2020-11-01 MED ORDER — ROSUVASTATIN CALCIUM 5 MG PO TABS: 5.0000 mg | ORAL_TABLET | Freq: Every day | ORAL | 0 refills | Status: DC

## 2020-12-03 DIAGNOSIS — E559 Vitamin D deficiency, unspecified: Secondary | ICD-10-CM | POA: Diagnosis not present

## 2020-12-03 DIAGNOSIS — J45901 Unspecified asthma with (acute) exacerbation: Secondary | ICD-10-CM | POA: Diagnosis not present

## 2020-12-03 DIAGNOSIS — E611 Iron deficiency: Secondary | ICD-10-CM | POA: Diagnosis not present

## 2020-12-03 DIAGNOSIS — R399 Unspecified symptoms and signs involving the genitourinary system: Secondary | ICD-10-CM | POA: Diagnosis not present

## 2020-12-03 DIAGNOSIS — E782 Mixed hyperlipidemia: Secondary | ICD-10-CM | POA: Diagnosis not present

## 2020-12-03 DIAGNOSIS — Z0000 Encounter for general adult medical examination without abnormal findings: Secondary | ICD-10-CM | POA: Diagnosis not present

## 2020-12-03 DIAGNOSIS — R5382 Chronic fatigue, unspecified: Secondary | ICD-10-CM | POA: Diagnosis not present

## 2020-12-03 DIAGNOSIS — E538 Deficiency of other specified B group vitamins: Secondary | ICD-10-CM | POA: Diagnosis not present

## 2020-12-03 DIAGNOSIS — E1165 Type 2 diabetes mellitus with hyperglycemia: Secondary | ICD-10-CM | POA: Diagnosis not present

## 2020-12-03 DIAGNOSIS — I10 Essential (primary) hypertension: Secondary | ICD-10-CM | POA: Diagnosis not present

## 2020-12-03 DIAGNOSIS — Z23 Encounter for immunization: Secondary | ICD-10-CM | POA: Diagnosis not present
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
## 2021-01-06 ENCOUNTER — Other Ambulatory Visit: Payer: Self-pay | Admitting: Internal Medicine

## 2021-01-06 ENCOUNTER — Encounter: Payer: Self-pay | Admitting: Internal Medicine

## 2021-01-06 ENCOUNTER — Telehealth: Payer: Self-pay | Admitting: Internal Medicine

## 2021-01-06 ENCOUNTER — Other Ambulatory Visit: Payer: Self-pay

## 2021-01-06 ENCOUNTER — Ambulatory Visit (INDEPENDENT_AMBULATORY_CARE_PROVIDER_SITE_OTHER): Payer: Medicare Other | Admitting: Internal Medicine

## 2021-01-06 DIAGNOSIS — J449 Chronic obstructive pulmonary disease, unspecified: Secondary | ICD-10-CM | POA: Diagnosis not present

## 2021-01-06 DIAGNOSIS — J0101 Acute recurrent maxillary sinusitis: Secondary | ICD-10-CM

## 2021-01-06 MED ORDER — AMOXICILLIN-POT CLAVULANATE 875-125 MG PO TABS: 1.0000 | ORAL_TABLET | Freq: Two times a day (BID) | ORAL | 0 refills | Status: DC

## 2021-01-06 MED ORDER — ALBUTEROL SULFATE HFA 108 (90 BASE) MCG/ACT IN AERS: INHALATION_SPRAY | RESPIRATORY_TRACT | 11 refills | Status: DC

## 2021-01-06 NOTE — Patient Instructions (Signed)
Albuterol rescue inhaler refilled  Augmentin script sent to hold in case needed

## 2021-01-06 NOTE — Telephone Encounter (Signed)
CY please advise.  The pharmacy stated that the abx that was sent in is on backorder.  Thanks

## 2021-01-06 NOTE — Telephone Encounter (Signed)
Called and spoke with the pharmacist at [facility]. She stated that the Augmentin is currently on back order and they are not sure when they receive it. She wanted to know if CY would be willing to send in another abx. Per the pharmacist, the Augmentin is the only abx they have that is on back order.   CY, can you please advise? Thanks.

## 2021-01-07 ENCOUNTER — Other Ambulatory Visit: Payer: Self-pay | Admitting: Internal Medicine

## 2021-01-07 MED ORDER — AMOXICILLIN 500 MG PO TABS: 500.0000 mg | ORAL_TABLET | Freq: Two times a day (BID) | ORAL | 0 refills | Status: DC

## 2021-01-07 NOTE — Telephone Encounter (Signed)
Augmentin script sent

## 2021-01-07 NOTE — Telephone Encounter (Signed)
New RX has been sent to [facility]. Called and spoke with the pharmacist. He verbalized understanding and cancelled the Augmentin RX. He will go ahead and fill the amoxicillin.   Nothing further needed at time of call.

## 2021-01-07 NOTE — Addendum Note (Signed)
Addended by: Valerie Salts on: 01/07/2021 03:10 PM   Modules accepted: Orders

## 2021-01-07 NOTE — Telephone Encounter (Signed)
I misunderstood and just rewrote same script for augmentin. Please ask drug store to cancel that and change to amoxacillin 500 mg, # 20, 1 twice daily

## 2021-01-23 ENCOUNTER — Encounter: Payer: Self-pay | Admitting: Internal Medicine

## 2021-01-23 NOTE — Assessment & Plan Note (Signed)
Suspect incomplete clearing after infection in April. Plan- Augmentin to hold with discusssion

## 2021-01-23 NOTE — Assessment & Plan Note (Signed)
Infrequent need, but we will refill albuterol hfa to keep it available

## 2021-01-31 DIAGNOSIS — J019 Acute sinusitis, unspecified: Secondary | ICD-10-CM | POA: Diagnosis not present

## 2021-02-07 DIAGNOSIS — J324 Chronic pansinusitis: Secondary | ICD-10-CM | POA: Diagnosis not present

## 2021-03-07 DIAGNOSIS — H01001 Unspecified blepharitis right upper eyelid: Secondary | ICD-10-CM | POA: Diagnosis not present

## 2021-03-07 DIAGNOSIS — H2000 Unspecified acute and subacute iridocyclitis: Secondary | ICD-10-CM | POA: Diagnosis not present

## 2021-03-07 DIAGNOSIS — H00011 Hordeolum externum right upper eyelid: Secondary | ICD-10-CM | POA: Diagnosis not present

## 2021-03-15 DIAGNOSIS — Z23 Encounter for immunization: Secondary | ICD-10-CM | POA: Diagnosis not present

## 2021-03-22 DIAGNOSIS — B0052 Herpesviral keratitis: Secondary | ICD-10-CM | POA: Diagnosis not present

## 2021-03-24 DIAGNOSIS — B0052 Herpesviral keratitis: Secondary | ICD-10-CM | POA: Diagnosis not present

## 2021-03-28 DIAGNOSIS — B0052 Herpesviral keratitis: Secondary | ICD-10-CM | POA: Diagnosis not present

## 2021-04-04 DIAGNOSIS — B0052 Herpesviral keratitis: Secondary | ICD-10-CM | POA: Diagnosis not present

## 2021-04-11 DIAGNOSIS — B0052 Herpesviral keratitis: Secondary | ICD-10-CM | POA: Diagnosis not present

## 2021-04-11 DIAGNOSIS — H16101 Unspecified superficial keratitis, right eye: Secondary | ICD-10-CM | POA: Diagnosis not present

## 2021-04-16 DIAGNOSIS — U071 COVID-19: Secondary | ICD-10-CM | POA: Diagnosis not present

## 2021-04-26 DIAGNOSIS — H1820 Unspecified corneal edema: Secondary | ICD-10-CM | POA: Diagnosis not present

## 2021-04-26 DIAGNOSIS — B0052 Herpesviral keratitis: Secondary | ICD-10-CM | POA: Diagnosis not present

## 2021-06-06 ENCOUNTER — Other Ambulatory Visit: Payer: Self-pay | Admitting: Family Medicine

## 2021-06-06 DIAGNOSIS — Z1231 Encounter for screening mammogram for malignant neoplasm of breast: Secondary | ICD-10-CM

## 2021-06-21 ENCOUNTER — Encounter: Payer: Self-pay | Admitting: Cardiology

## 2021-06-21 ENCOUNTER — Ambulatory Visit
Admission: RE | Admit: 2021-06-21 | Discharge: 2021-06-21 | Disposition: A | Discharge status: Home / Self Care | Payer: Medicare Other | Source: Ambulatory Visit | Attending: Family Medicine | Admitting: Family Medicine

## 2021-06-21 ENCOUNTER — Ambulatory Visit (INDEPENDENT_AMBULATORY_CARE_PROVIDER_SITE_OTHER): Payer: Medicare Other | Admitting: Cardiology

## 2021-06-21 ENCOUNTER — Other Ambulatory Visit: Payer: Self-pay

## 2021-06-21 VITALS — BP 140/80 | HR 67 | Ht 61.0 in | Wt 166.0 lb

## 2021-06-21 DIAGNOSIS — I351 Nonrheumatic aortic (valve) insufficiency: Secondary | ICD-10-CM | POA: Diagnosis not present

## 2021-06-21 DIAGNOSIS — I452 Bifascicular block: Secondary | ICD-10-CM | POA: Diagnosis not present

## 2021-06-21 DIAGNOSIS — Z789 Other specified health status: Secondary | ICD-10-CM | POA: Insufficient documentation

## 2021-06-21 DIAGNOSIS — E7841 Elevated Lipoprotein(a): Secondary | ICD-10-CM

## 2021-06-21 DIAGNOSIS — Z1231 Encounter for screening mammogram for malignant neoplasm of breast: Secondary | ICD-10-CM | POA: Diagnosis not present

## 2021-06-21 DIAGNOSIS — E7800 Pure hypercholesterolemia, unspecified: Secondary | ICD-10-CM | POA: Insufficient documentation

## 2021-06-21 DIAGNOSIS — I493 Ventricular premature depolarization: Secondary | ICD-10-CM | POA: Diagnosis not present

## 2021-06-21 NOTE — Assessment & Plan Note (Addendum)
No pacemaker needs at this point.  May evolve to that.  Stable, no high risk symptoms such as syncope

## 2021-06-21 NOTE — Patient Instructions (Signed)
Medication Instructions:  The current medical regimen is effective;  continue present plan and medications.  *If you need a refill on your cardiac medications before your next appointment, please call your pharmacy*  Testing/Procedures: Your physician has requested that you have an echocardiogram. Echocardiography is a painless test that uses sound waves to create images of your heart. It provides your doctor with information about the size and shape of your heart and how well your hearts chambers and valves are working. This procedure takes approximately one hour. There are no restrictions for this procedure.  Your physician has requested that you have a Coronary Calcium score which is completed by CT. Cardiac computed tomography (CT) is a painless test that uses an x-ray machine to take clear, detailed pictures of your heart. There are no instructions for this testing.  You may eat/drink and take your normal medications this day.  The cost of the testing is $99 due at the time of your appointment.  Follow-Up: At [facility], you and your health needs are our priority.  As part of our continuing mission to provide you with exceptional heart care, we have created designated Provider Care Teams.  These Care Teams include your primary Cardiologist (physician) and Advanced Practice Providers (APPs -  Physician Assistants and Nurse Practitioners) who all work together to provide you with the care you need, when you need it.  We recommend signing up for the patient portal called "MyChart".  Sign up information is provided on this After Visit Summary.  MyChart is used to connect with patients for Virtual Visits (Telemedicine).  Patients are able to view lab/test results, encounter notes, upcoming appointments, etc.  Non-urgent messages can be sent to your provider as well.   To learn more about what you can do with MyChart, go to [email or web address].    Your next appointment:   1 year(s)  The  format for your next appointment:   In Person  Provider:   Candee Furbish, MD     Thank you for choosing [facility]!!

## 2021-06-21 NOTE — Addendum Note (Signed)
Addended by: Maren Beach, Chayden Garrelts A on: 06/21/2021 01:55 PM   Modules accepted: Orders

## 2021-06-21 NOTE — Progress Notes (Signed)
Cardiology Office Note:    Date:  06/21/2021   ID:  BABY Debbie Norris, DOB 1950-07-09, MRN [id number]  PCP:  Maurice Small, MD   [facility] Providers Cardiologist:  Candee Furbish, MD     Referring MD: Maurice Small, MD    History of Present Illness:    Debbie Norris is a 71 y.o. female here for follow up statin intolerance.   She has a history of mild mitral and aortic regurgitation per echocardiogram 2020, PVCs, RBBB/left anterior fascicular block/bifascicular block, DM2, HLD with known statin intolerance.    Has tried Livalo, atorvastatin, pravastatin and simvastatin Zetia. Now on Crestor 5mg  twice a well   Family history of MI in mother at age 38.  Also has a history of gastric bypass surgery in 2006.  Noted to have tried Crestor 5 mg twice a week with an LDL of 120 05/13/2018.  Soon after she had leg aches, Crestor holiday for 2 weeks and reevaluate her leg discomfort. Did report bilateral leg weakness however no pain.  Was interested in continuing Crestor daily at this time.  She is concerned about her kidney function.    Discussed possible referral to lipid clinic for further discussion regarding PCSK9 inhibitors.  Patient seemed interested at this time.  We set that up in 2021 but she had to cancel because of [place] away at beach.   Sister had AFIB after anesthesia.   She felt a change in rhythm for a minute the year.  No real chest pain.  No shortness of breath.  Past Medical History:  Diagnosis Date   Abdominal pain    Asthma    Atrophic vaginitis    Cancer ([place])    basel cell   Complication of anesthesia    Depression    Diabetes ([place])    High blood pressure    Hx of colonic polyps    Hyperlipidemia    Obesity    GASTRIC BYPASS IN 2006   PONV (postoperative nausea and vomiting)    with inhaled anesthetic    Vitamin D deficiency     Past Surgical History:  Procedure Laterality Date   ABDOMINAL HYSTERECTOMY     BREAST CYST ASPIRATION Right unsure    pt states 30+ years ago   COLONOSCOPY     FACIAL COSMETIC SURGERY     GASTRIC BYPASS     NASAL SINUS SURGERY     TRIGGER FINGER RELEASE Right 04/24/2019   Procedure: RELEASE TRIGGER FINGER/A-1 PULLEY RIGHT INDEX FINGER;  Surgeon: Leanora Cover, MD;  Location: [place];  Service: Orthopedics;  Laterality: Right;    Current Medications: Current Meds  Medication Sig   albuterol (PROVENTIL HFA) 108 (90 Base) MCG/ACT inhaler TAKE 2 PUFFS BY MOUTH EVERY 4 TO 6 HOURS AS NEEDED   ALPRAZolam (XANAX) 0.5 MG tablet Take 0.5 mg by mouth daily as needed for anxiety.   Calcium Carbonate Antacid (TUMS PO) Take 1,000 mg by mouth 3 (three) times daily.   cetirizine (ZYRTEC) 10 MG chewable tablet Chew 10 mg by mouth daily.   Coenzyme Q10 (Q-10 CO-ENZYME PO) Take 300 mg by mouth daily.   cyclobenzaprine (FLEXERIL) 10 MG tablet Take 10 mg by mouth every 8 (eight) hours as needed.   diclofenac sodium (VOLTAREN) 1 % GEL As directed   Ferrous Sulfate (SLOW FE PO) Take by mouth.   FLUoxetine (PROZAC) 40 MG capsule Take 40 mg by mouth daily.   fluticasone (FLONASE) 50 MCG/ACT nasal spray  Place 2 sprays into both nostrils 2 (two) times daily.    ONE TOUCH ULTRA TEST test strip 1 each by Other route as needed for other.    pantoprazole (PROTONIX) 40 MG tablet Take 40 mg by mouth 2 (two) times daily at 10 am and 4 pm.    rosuvastatin (CRESTOR) 5 MG tablet Take 1 tablet (5 mg total) by mouth daily. Please schedule appointment for future refills. Thank you (Patient taking differently: Take 5 mg by mouth 2 (two) times a week. Please schedule appointment for future refills. Thank you)   tolterodine (DETROL LA) 4 MG 24 hr capsule Take 1 capsule by mouth daily.    VAGIFEM 10 MCG TABS vaginal tablet Place 1 tablet vaginally 2 (two) times a week.    vitamin B-12 (CYANOCOBALAMIN) 1000 MCG tablet Take 1,000 mcg by mouth daily.   Vitamin D, Ergocalciferol, (DRISDOL) 50000 UNITS CAPS capsule Take 50,000 Units by  mouth daily. Sundays   zolpidem (AMBIEN) 10 MG tablet Take 10 mg by mouth at bedtime as needed for sleep.      Allergies:   Cream base, Pineapple, Toradol [ketorolac tromethamine], Sevoflurane, Bextra [valdecoxib], Lipitor [atorvastatin], Metformin and related, Other, Phenergan [promethazine hcl], and Prinivil [lisinopril]   Social History   Socioeconomic History   Marital status: Married    Spouse name: Not on file   Number of children: Not on file   Years of education: Not on file   Highest education level: Not on file  Occupational History   Not on file  Tobacco Use   Smoking status: Never   Smokeless tobacco: Never  Vaping Use   Vaping Use: Never used  Substance and Sexual Activity   Alcohol use: Yes    Comment: social   Drug use: No   Sexual activity: Not on file  Other Topics Concern   Not on file  Social History Narrative   Not on file   Social Determinants of Health   Financial Resource Strain: Not on file  Food Insecurity: Not on file  Transportation Needs: Not on file  Physical Activity: Not on file  Stress: Not on file  Social Connections: Not on file     Family History: The patient's family history includes Cancer in her father; Emphysema in her paternal aunt; Heart disease in her father and mother.  ROS:   Please see the history of present illness.     All other systems reviewed and are negative.  EKGs/Labs/Other Studies Reviewed:    The following studies were reviewed today: Echocardiogram 06/21/2018:   1. The left ventricle has normal systolic function with an ejection  fraction of 60-65%. The cavity size was normal. There is mildly increased  left ventricular wall thickness. Left ventricular diastolic Doppler  parameters are consistent with impaired  relaxation No evidence of left ventricular regional wall motion  abnormalities.   2. The right ventricle has normal systolic function. The cavity was  normal. There is no increase in right  ventricular wall thickness.   3. The mitral valve is normal in structure. There is moderate mitral  annular calcification present. No evidence of mitral valve stenosis.  Trivial regurgitation.   4. The tricuspid valve is normal in structure.   5. The aortic valve is tricuspid Mild calcification of the aortic valve.  no stenosis of the aortic valve.   6. The pulmonic valve was normal in structure.   7. Right atrial pressure is estimated at 3 mmHg.   8. No complete  TR doppler jet so unable to estimate PA systolic pressure.     EKG:  EKG is  ordered today.  The ekg ordered today demonstrates SR 67 RBBB LAFB  Recent Labs: No results found for requested labs within last 8760 hours.  Recent Lipid Panel No results found for: CHOL, TRIG, HDL, CHOLHDL, VLDL, LDLCALC, LDLDIRECT   Risk Assessment/Calculations:              Physical Exam:    VS:  BP 140/80 (BP Location: Left Arm, Patient Position: Sitting, Cuff Size: Normal)    Pulse 67    Ht 5\' 1"  (1.549 m)    Wt 166 lb (75.3 kg)    SpO2 95%    BMI 31.37 kg/m     Wt Readings from Last 3 Encounters:  06/21/21 166 lb (75.3 kg)  01/06/21 162 lb 12.8 oz (73.8 kg)  01/07/20 161 lb 3.2 oz (73.1 kg)     GEN:  Well nourished, well developed in no acute distress HEENT: Normal NECK: No JVD; No carotid bruits LYMPHATICS: No lymphadenopathy CARDIAC: RRR, no murmurs, no rubs, gallops RESPIRATORY:  Clear to auscultation without rales, wheezing or rhonchi  ABDOMEN: Soft, non-tender, non-distended MUSCULOSKELETAL:  No edema; No deformity  SKIN: Warm and dry NEUROLOGIC:  Alert and oriented x 3 PSYCHIATRIC:  Normal affect   ASSESSMENT:    1. Elevated lipoprotein(a)   2. Bifascicular block   3. Nonrheumatic aortic valve insufficiency   4. Statin intolerance   5. PVC (premature ventricular contraction)   6. Pure hypercholesterolemia    PLAN:    In order of problems listed above:  Bifascicular block No pacemaker needs at this point.   May evolve to that.  Stable, no high risk symptoms such as syncope  Nonrheumatic aortic valve insufficiency Previously mild. Reassuring. Repeat ECHO  PVC (premature ventricular contraction) Stable. No complaints.  She did mention that once during the year she felt a slightly irregular rhythm.  Continue to monitor.  Her sister did have atrial fibrillation surrounding surgery.  Pure hypercholesterolemia We will check a coronary calcium score.  If  abnormal, we will send to our lipid clinic for further discussion.  She is able to tolerate 5 mg of Crestor twice weekly.         Medication Adjustments/Labs and Tests Ordered: Current medicines are reviewed at length with the patient today.  Concerns regarding medicines are outlined above.  Orders Placed This Encounter  Procedures   CT CARDIAC SCORING (SELF PAY ONLY)   ECHOCARDIOGRAM COMPLETE   No orders of the defined types were placed in this encounter.   Patient Instructions  Medication Instructions:  The current medical regimen is effective;  continue present plan and medications.  *If you need a refill on your cardiac medications before your next appointment, please call your pharmacy*  Testing/Procedures: Your physician has requested that you have an echocardiogram. Echocardiography is a painless test that uses sound waves to create images of your heart. It provides your doctor with information about the size and shape of your heart and how well your hearts chambers and valves are working. This procedure takes approximately one hour. There are no restrictions for this procedure.  Your physician has requested that you have a Coronary Calcium score which is completed by CT. Cardiac computed tomography (CT) is a painless test that uses an x-ray machine to take clear, detailed pictures of your heart. There are no instructions for this testing.  You may eat/drink and take your  normal medications this day.  The cost of the testing is $99  due at the time of your appointment.  Follow-Up: At [facility], you and your health needs are our priority.  As part of our continuing mission to provide you with exceptional heart care, we have created designated Provider Care Teams.  These Care Teams include your primary Cardiologist (physician) and Advanced Practice Providers (APPs -  Physician Assistants and Nurse Practitioners) who all work together to provide you with the care you need, when you need it.  We recommend signing up for the patient portal called "MyChart".  Sign up information is provided on this After Visit Summary.  MyChart is used to connect with patients for Virtual Visits (Telemedicine).  Patients are able to view lab/test results, encounter notes, upcoming appointments, etc.  Non-urgent messages can be sent to your provider as well.   To learn more about what you can do with MyChart, go to [email or web address].    Your next appointment:   1 year(s)  The format for your next appointment:   In Person  Provider:   Candee Furbish, MD     Thank you for choosing [facility]!!      Signed, Candee Furbish, MD  06/21/2021 10:03 AM    [place]

## 2021-06-21 NOTE — Assessment & Plan Note (Addendum)
Stable. No complaints.  She did mention that once during the year she felt a slightly irregular rhythm.  Continue to monitor.  Her sister did have atrial fibrillation surrounding surgery.

## 2021-06-21 NOTE — Assessment & Plan Note (Signed)
We will check a coronary calcium score.  If  abnormal, we will send to our lipid clinic for further discussion.  She is able to tolerate 5 mg of Crestor twice weekly.

## 2021-06-21 NOTE — Assessment & Plan Note (Signed)
Previously mild. Reassuring. Repeat ECHO

## 2021-07-04 DIAGNOSIS — H52203 Unspecified astigmatism, bilateral: Secondary | ICD-10-CM | POA: Diagnosis not present

## 2021-07-04 DIAGNOSIS — E119 Type 2 diabetes mellitus without complications: Secondary | ICD-10-CM | POA: Diagnosis not present

## 2021-07-04 DIAGNOSIS — H1789 Other corneal scars and opacities: Secondary | ICD-10-CM | POA: Diagnosis not present

## 2021-07-08 ENCOUNTER — Ambulatory Visit (INDEPENDENT_AMBULATORY_CARE_PROVIDER_SITE_OTHER)
Admission: RE | Admit: 2021-07-08 | Discharge: 2021-07-08 | Disposition: A | Discharge status: Home / Self Care | Payer: Self-pay | Source: Ambulatory Visit | Attending: Cardiology | Admitting: Cardiology

## 2021-07-08 ENCOUNTER — Ambulatory Visit (HOSPITAL_COMMUNITY): Payer: Medicare Other | Attending: Cardiology

## 2021-07-08 ENCOUNTER — Other Ambulatory Visit: Payer: Self-pay

## 2021-07-08 DIAGNOSIS — I351 Nonrheumatic aortic (valve) insufficiency: Secondary | ICD-10-CM | POA: Diagnosis not present

## 2021-07-08 DIAGNOSIS — Z789 Other specified health status: Secondary | ICD-10-CM

## 2021-07-08 DIAGNOSIS — E7841 Elevated Lipoprotein(a): Secondary | ICD-10-CM

## 2021-07-08 LAB — ECHOCARDIOGRAM COMPLETE
Area-P 1/2: 2.55 cm2
P 1/2 time: 418 msec
S' Lateral: 2.3 cm

## 2021-07-11 DIAGNOSIS — L814 Other melanin hyperpigmentation: Secondary | ICD-10-CM | POA: Diagnosis not present

## 2021-07-11 DIAGNOSIS — L821 Other seborrheic keratosis: Secondary | ICD-10-CM | POA: Diagnosis not present

## 2021-07-11 DIAGNOSIS — L570 Actinic keratosis: Secondary | ICD-10-CM | POA: Diagnosis not present

## 2021-07-11 DIAGNOSIS — Z85828 Personal history of other malignant neoplasm of skin: Secondary | ICD-10-CM | POA: Diagnosis not present

## 2021-07-11 DIAGNOSIS — L578 Other skin changes due to chronic exposure to nonionizing radiation: Secondary | ICD-10-CM | POA: Diagnosis not present

## 2021-07-11 DIAGNOSIS — Z23 Encounter for immunization: Secondary | ICD-10-CM | POA: Diagnosis not present

## 2021-07-12 DIAGNOSIS — R300 Dysuria: Secondary | ICD-10-CM | POA: Diagnosis not present

## 2021-07-12 DIAGNOSIS — E1165 Type 2 diabetes mellitus with hyperglycemia: Secondary | ICD-10-CM | POA: Diagnosis not present

## 2021-07-12 DIAGNOSIS — I351 Nonrheumatic aortic (valve) insufficiency: Secondary | ICD-10-CM | POA: Diagnosis not present

## 2021-07-12 DIAGNOSIS — F411 Generalized anxiety disorder: Secondary | ICD-10-CM | POA: Diagnosis not present

## 2021-07-12 DIAGNOSIS — I10 Essential (primary) hypertension: Secondary | ICD-10-CM | POA: Diagnosis not present

## 2021-07-12 DIAGNOSIS — N3281 Overactive bladder: Secondary | ICD-10-CM | POA: Diagnosis not present

## 2021-07-12 DIAGNOSIS — E559 Vitamin D deficiency, unspecified: Secondary | ICD-10-CM | POA: Diagnosis not present

## 2021-07-12 DIAGNOSIS — E782 Mixed hyperlipidemia: Secondary | ICD-10-CM | POA: Diagnosis not present

## 2021-07-12 DIAGNOSIS — G4700 Insomnia, unspecified: Secondary | ICD-10-CM | POA: Diagnosis not present

## 2021-07-14 ENCOUNTER — Telehealth: Payer: Self-pay | Admitting: Cardiology

## 2021-07-14 NOTE — Telephone Encounter (Signed)
Spoke with patient and discussed results of calcium score. ? ?Per Dr. Marlou Porch, patient to see [facility] for evaluation of PCSK9i. Patient has appointment for 08/09/21 with PharmD. ? ?Patient verbalized understanding. ?

## 2021-07-14 NOTE — Telephone Encounter (Signed)
Patient returning call for CT results. She states she is aware to schedule an appointment with Lipid Clinic. She is scheduled 4/3 with PharmD. She states if there is anything else she needs to know to call her back.  ?

## 2021-07-19 DIAGNOSIS — J0100 Acute maxillary sinusitis, unspecified: Secondary | ICD-10-CM | POA: Diagnosis not present

## 2021-08-09 ENCOUNTER — Ambulatory Visit (INDEPENDENT_AMBULATORY_CARE_PROVIDER_SITE_OTHER): Payer: Medicare Other | Admitting: Pharmacist

## 2021-08-09 DIAGNOSIS — E7800 Pure hypercholesterolemia, unspecified: Secondary | ICD-10-CM | POA: Diagnosis not present

## 2021-08-09 DIAGNOSIS — Z789 Other specified health status: Secondary | ICD-10-CM | POA: Diagnosis not present

## 2021-08-09 LAB — LIPID PANEL
Chol/HDL Ratio: 2.4 ratio (ref 0.0–4.4)
Cholesterol, Total: 219 mg/dL — ABNORMAL HIGH (ref 100–199)
HDL: 92 mg/dL (ref >39)
LDL Chol Calc (NIH): 108 mg/dL — ABNORMAL HIGH (ref 0–99)
Triglycerides: 113 mg/dL (ref 0–149)
VLDL Cholesterol Cal: 19 mg/dL (ref 5–40)

## 2021-08-09 NOTE — Patient Instructions (Signed)
Your LDL cholesterol is 85 and your goal is < 70 ? ?I will submit information to your insurance for Repatha and let you know when I hear back.  ?  ?Repatha is a subcutaneous injection given once every 2 weeks in the fatty tissue of your stomach or upper outer thigh. Store the medication in the fridge. You can let your dose warm up to room temperature for 30 minutes before injecting if you prefer. Repatha will lower your LDL cholesterol by 60% and helps to lower your chance of having a heart attack or stroke. ? ? ?

## 2021-08-09 NOTE — Progress Notes (Signed)
Patient ID: Debbie Norris                 DOB: 06-20-1950                    MRN: [id number] ? ? ? ? ?HPI: ?Debbie Norris is a 71 y.o. female patient referred to lipid clinic by Dr Marlou Porch. PMH is significant for mild mitral and aortic regurgitation per echo 2020, PVCs, RBBB/left anterior fascicular block/bifascicular block, DM2, HLD, gastric bypass in 2006, FHx of premature CAD and statin intolerance. She udnerwent coronary calcium scoring on 07/08/21 which showed calcium score of 82 (72nd percentile for age, race, and sex-matched controls). ? ?Pt presents today with her husband for follow up. Experienced myalgias and CK elevation up to 500 on prior statins. GI issues on Welchol. Has been tolerating rosuvastatin '5mg'$  twice weekly ok, still has some cramping though. Takes CoQ10 as well. PCP is starting her on Ozempic, has some questions about this. High copay on her plan - $500 deductible then $135/3 month supply. Had restarted glipizide 2.'5mg'$  and experienced some low glucose readings. ? ?Current Medications: rosuvastatin '5mg'$  twice weekly ?Intolerances: atorvastatin, pravastatin, simvastatin, pitavastatin '1mg'$  daily, rosuvastatin '5mg'$  daily, ezetimibe- muscle aches, Welchol - GI issues ?Risk Factors: HLD, DM, FHx premature CAD, elevated CAC score ?LDL goal: '70mg'$ /dL ? ?Family History: Mother with MI at age 45. Cancer in her father; Emphysema in her paternal aunt; Heart disease in her father and mother. ? ?Social History: Denies tobacco and drug use, social alcohol use. ? ?Labs: ?12/03/20: TC 201, TG 127, HDL 94, LDL 85, nonHDL 107 (rosuvastatin '5mg'$  twice weekly) ? ?Past Medical History:  ?Diagnosis Date  ? Abdominal pain   ? Asthma   ? Atrophic vaginitis   ? Cancer [facility])   ? basel cell  ? Complication of anesthesia   ? Depression   ? Diabetes ([place])   ? High blood pressure   ? Hx of colonic polyps   ? Hyperlipidemia   ? Obesity   ? GASTRIC BYPASS IN 2006  ? PONV (postoperative nausea and vomiting)   ? with inhaled  anesthetic   ? Vitamin D deficiency   ? ? ?Current Outpatient Medications on File Prior to Visit  ?Medication Sig Dispense Refill  ? albuterol (PROVENTIL HFA) 108 (90 Base) MCG/ACT inhaler TAKE 2 PUFFS BY MOUTH EVERY 4 TO 6 HOURS AS NEEDED 6.7 each 11  ? ALPRAZolam (XANAX) 0.5 MG tablet Take 0.5 mg by mouth daily as needed for anxiety.    ? Calcium Carbonate Antacid (TUMS PO) Take 1,000 mg by mouth 3 (three) times daily.    ? cetirizine (ZYRTEC) 10 MG chewable tablet Chew 10 mg by mouth daily.    ? Coenzyme Q10 (Q-10 CO-ENZYME PO) Take 300 mg by mouth daily.    ? cyclobenzaprine (FLEXERIL) 10 MG tablet Take 10 mg by mouth every 8 (eight) hours as needed.    ? diclofenac sodium (VOLTAREN) 1 % GEL As directed  1  ? Ferrous Sulfate (SLOW FE PO) Take by mouth.    ? FLUoxetine (PROZAC) 40 MG capsule Take 40 mg by mouth daily.    ? fluticasone (FLONASE) 50 MCG/ACT nasal spray Place 2 sprays into both nostrils 2 (two) times daily.     ? ONE TOUCH ULTRA TEST test strip 1 each by Other route as needed for other.   3  ? pantoprazole (PROTONIX) 40 MG tablet Take 40 mg by mouth 2 (two) times daily  at 10 am and 4 pm.     ? rosuvastatin (CRESTOR) 5 MG tablet Take 1 tablet (5 mg total) by mouth daily. Please schedule appointment for future refills. Thank you (Patient taking differently: Take 5 mg by mouth 2 (two) times a week. Please schedule appointment for future refills. Thank you) 15 tablet 0  ? tolterodine (DETROL LA) 4 MG 24 hr capsule Take 1 capsule by mouth daily.     ? VAGIFEM 10 MCG TABS vaginal tablet Place 1 tablet vaginally 2 (two) times a week.   3  ? vitamin B-12 (CYANOCOBALAMIN) 1000 MCG tablet Take 1,000 mcg by mouth daily.    ? Vitamin D, Ergocalciferol, (DRISDOL) 50000 UNITS CAPS capsule Take 50,000 Units by mouth daily. Sundays    ? zolpidem (AMBIEN) 10 MG tablet Take 10 mg by mouth at bedtime as needed for sleep.     ? ?No current facility-administered medications on file prior to visit.  ? ? ?Allergies   ?Allergen Reactions  ? Cream Base Rash  ? Pineapple Hives and Other (See Comments)  ?  Stomach cramps  ? Toradol [Ketorolac Tromethamine]   ?  swelling  ? Sevoflurane Nausea And Vomiting  ?  Patient states had immediate projectile vomiting.  1998 and 2013  ? Bextra [Valdecoxib] Swelling  ? Lipitor [Atorvastatin]   ?  Muscle pain, high level CPK  ? Metformin And Related   ?  Muscle pain/cramps  ? Other   ?  All vaginal / genital creams: causes severe rash with itching  ? Phenergan [Promethazine Hcl]   ?  Itchy ?  ? Prinivil [Lisinopril] Cough  ? ? ?Assessment/Plan: ? ?1. Hyperlipidemia - LDL 85 last July, will recheck baseline today as pt has been taking rosuvastatin '5mg'$  twice weekly for the past few months. LDL goal < 70 due to elevated calcium score. Discussed addition of PCSK9i today given prior intolerance to multiple other statins + ezetimibe. Pt agreeable to try PCSK9i if LDL is above 70 on labs checked today. Would pursue [facility] as well to help with pt's $500 medication deductible. ? ?Serria Sloma E. Loranzo Desha, PharmD, BCACP, CPP ?[place] [place], [place] [address] ?Phone: [phone]; Fax: [phone] ?08/09/2021 12:02 PM ? ? ? ?

## 2021-08-10 ENCOUNTER — Telehealth: Payer: Self-pay | Admitting: Pharmacist

## 2021-08-10 DIAGNOSIS — Z789 Other specified health status: Secondary | ICD-10-CM

## 2021-08-10 DIAGNOSIS — E7800 Pure hypercholesterolemia, unspecified: Secondary | ICD-10-CM

## 2021-08-10 MED ORDER — REPATHA SURECLICK 140 MG/ML ~~LOC~~ SOAJ: 1.0000 "pen " | SUBCUTANEOUS | 3 refills | Status: DC

## 2021-08-10 NOTE — Telephone Encounter (Signed)
Updated baseline labs on rosuvastatin '5mg'$  3x weekly (max tolerated dose) show LDL at 108, above goal < 70. Will submit prior authorization for Repatha, then follow up with pt. She will require [employer] assistance as well. ?

## 2021-08-10 NOTE — Telephone Encounter (Signed)
Repatha prior auth approved immediately through 08/10/22. [place] approved through 07/11/22: ? ?CARD NO. ?[id number] ? ?BIN ?[id number] ?  ?PCN ?[id number] ?  ?GROUP ?[id number] ? ?Info provided to pharmacy. Confirmed rx is free and Ozempic copay is now $92 instead of > $500 because the grant took care of her deductible. Pt aware, scheduled f/u labs in 2 months to assess efficacy. ?

## 2021-08-23 DIAGNOSIS — L570 Actinic keratosis: Secondary | ICD-10-CM | POA: Diagnosis not present

## 2021-08-23 DIAGNOSIS — D1723 Benign lipomatous neoplasm of skin and subcutaneous tissue of right leg: Secondary | ICD-10-CM | POA: Diagnosis not present

## 2021-08-23 DIAGNOSIS — L821 Other seborrheic keratosis: Secondary | ICD-10-CM | POA: Diagnosis not present

## 2021-09-08 DIAGNOSIS — F330 Major depressive disorder, recurrent, mild: Secondary | ICD-10-CM | POA: Diagnosis not present

## 2021-09-08 DIAGNOSIS — H1789 Other corneal scars and opacities: Secondary | ICD-10-CM | POA: Diagnosis not present

## 2021-09-08 DIAGNOSIS — I10 Essential (primary) hypertension: Secondary | ICD-10-CM | POA: Diagnosis not present

## 2021-09-08 DIAGNOSIS — E1169 Type 2 diabetes mellitus with other specified complication: Secondary | ICD-10-CM | POA: Diagnosis not present

## 2021-09-08 DIAGNOSIS — M79602 Pain in left arm: Secondary | ICD-10-CM | POA: Diagnosis not present

## 2021-09-08 DIAGNOSIS — H04123 Dry eye syndrome of bilateral lacrimal glands: Secondary | ICD-10-CM | POA: Diagnosis not present

## 2021-10-28 DIAGNOSIS — I10 Essential (primary) hypertension: Secondary | ICD-10-CM | POA: Diagnosis not present

## 2021-10-28 DIAGNOSIS — M791 Myalgia, unspecified site: Secondary | ICD-10-CM | POA: Diagnosis not present

## 2021-10-28 DIAGNOSIS — N3281 Overactive bladder: Secondary | ICD-10-CM | POA: Diagnosis not present

## 2021-10-28 DIAGNOSIS — F411 Generalized anxiety disorder: Secondary | ICD-10-CM | POA: Diagnosis not present

## 2021-10-28 DIAGNOSIS — E559 Vitamin D deficiency, unspecified: Secondary | ICD-10-CM | POA: Diagnosis not present

## 2021-10-28 DIAGNOSIS — R399 Unspecified symptoms and signs involving the genitourinary system: Secondary | ICD-10-CM | POA: Diagnosis not present

## 2021-10-28 DIAGNOSIS — E1165 Type 2 diabetes mellitus with hyperglycemia: Secondary | ICD-10-CM | POA: Diagnosis not present

## 2021-10-28 DIAGNOSIS — E782 Mixed hyperlipidemia: Secondary | ICD-10-CM | POA: Diagnosis not present

## 2021-10-28 DIAGNOSIS — G4700 Insomnia, unspecified: Secondary | ICD-10-CM | POA: Diagnosis not present

## 2021-10-31 ENCOUNTER — Other Ambulatory Visit: Payer: Medicare Other

## 2021-10-31 DIAGNOSIS — E7800 Pure hypercholesterolemia, unspecified: Secondary | ICD-10-CM | POA: Diagnosis not present

## 2021-10-31 LAB — LIPID PANEL
Chol/HDL Ratio: 1.7 ratio (ref 0.0–4.4)
Cholesterol, Total: 193 mg/dL (ref 100–199)
HDL: 111 mg/dL (ref >39)
LDL Chol Calc (NIH): 63 mg/dL (ref 0–99)
Triglycerides: 111 mg/dL (ref 0–149)
VLDL Cholesterol Cal: 19 mg/dL (ref 5–40)

## 2021-10-31 LAB — HEPATIC FUNCTION PANEL
ALT: 28 IU/L (ref 0–32)
AST: 31 IU/L (ref 0–40)
Albumin: 4.6 g/dL (ref 3.8–4.8)
Alkaline Phosphatase: 69 IU/L (ref 44–121)
Bilirubin Total: 0.4 mg/dL (ref 0.0–1.2)
Bilirubin, Direct: 0.15 mg/dL (ref 0.00–0.40)
Total Protein: 6.6 g/dL (ref 6.0–8.5)

## 2022-01-15 DIAGNOSIS — R110 Nausea: Secondary | ICD-10-CM | POA: Diagnosis not present

## 2022-01-15 DIAGNOSIS — R252 Cramp and spasm: Secondary | ICD-10-CM | POA: Diagnosis not present

## 2022-01-15 DIAGNOSIS — R300 Dysuria: Secondary | ICD-10-CM | POA: Diagnosis not present

## 2022-01-17 ENCOUNTER — Ambulatory Visit: Payer: Medicare Other | Admitting: Internal Medicine

## 2022-02-12 NOTE — Progress Notes (Unsigned)
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 Allergy Profile-09/10/2013-negative-total IgE 12.5 with no specific elevations PFT 11/25/13-within normal limits with insignificant response to bronchodilator Triggers for asthma include strong odors, seasonal pollens, sinus infections, and the room at work which she goes into intermittently- experiencing hoarseness and cough. FENO 01/02/17- 12 ( not elevated) Office Spirometry 8/128/18- WNL-FVC 2.79/106%, FEV1 2.13/112%, ratio 0.77, FEF 25-75% 1.95/129% -----------------------------------------------------------------------------------   01/05/21- 71 year old female never smoker followed for Asthma, allergic rhinitis, Recurrent acute Maxillary Sinusitis complicated by GERD, aortic regurgitation, RBBB, DM2, depression, -Albuterol hfa, zyrtec, flonase,Mucinex. Covid vax 3 Phizer -----Reports pneumonia in April with mild residual congestion and productive cough.Tested Covid negative. Had CXR at [facility]. Zpak then levaquin.  Now past few days cough, scant yellow with sinus congestion. Rare need for albuterol rescue hfa but asks refill. Continues daily flonase.  02/14/22-  71 year old female never smoker followed for Asthma, allergic rhinitis, Recurrent acute Maxillary Sinusitis complicated by GERD, aortic regurgitation, RBBB, DM2, depression, -Albuterol hfa, zyrtec, flonase,Mucinex. Covid vax 3 Phizer Flu vax- senior ACT- 20 Not using maintenance inhaler. Feels asthma control is good. Occasional sinus infection. Asks about Flonase vs azelastine- discussed. Asks augmentin and diflucan to hold for upcoming trip to [place].  ROS-see HPI    + = positive Constitutional:   No-   weight loss, night sweats, fevers, chills, fatigue, lassitude. HEENT:   No-  headaches, +difficulty swallowing, tooth/dental problems, sore  throat,       Sneezing, itching, +ear ache, +nasal congestion, post nasal drip,  CV:  No-   chest pain, orthopnea, PND, swelling in lower extremities, anasarca,                                                                         dizziness, palpitations Resp: no-shortness of breath with exertion or at rest.             + productive cough, non-productive cough,  No- coughing up of blood.              No-   change in color of mucus.  No- wheezing.   Skin: No-   rash or lesions. GI:  No-heartburn, indigestion, abdominal pain, nausea, vomiting, GU:  MS:  +  joint pain or swelling.  No- decreased range of motion.  No- back pain. Neuro-     nothing unusual Psych:  No- change in mood or affect. + depression or anxiety.  No memory loss.  OBJ- Physical Exam General- Alert, Oriented, Affect-appropriate, Distress- none acute Skin- rash-none, lesions- none, excoriation- none Lymphadenopathy- none Head- atraumatic            Eyes- Gross vision intact, PERRLA, conjunctivae and secretions clear            Ears- Hearing, canals-normal            Nose- mucosa normal, no-Septal dev, +, no-polyps, erosion, perforation             Throat- Mallampati II , mucosa clear, drainage- none, tonsils- atrophic Neck- flexible , trachea midline, no stridor , thyroid nl, carotid no bruit Chest - symmetrical excursion ,  unlabored           Heart/CV- RRR ,  Murmur +1/6 syst A.I. , no gallop  , no rub, nl s1 s2                           - JVD- none , edema- none, stasis changes- none, varices- none           Lung- clear to P&A, wheeze- none, cough+loose/ with deep breath , dullness-none, rub- none           Chest wall-  Abd-  Br/ Gen/ Rectal- Not done, not indicated Extrem- cyanosis- none, clubbing, none, atrophy- none, strength- nl Neuro- grossly intact to observation

## 2022-02-14 ENCOUNTER — Encounter: Payer: Self-pay | Admitting: Internal Medicine

## 2022-02-14 ENCOUNTER — Ambulatory Visit (INDEPENDENT_AMBULATORY_CARE_PROVIDER_SITE_OTHER): Payer: Medicare Other | Admitting: Internal Medicine

## 2022-02-14 VITALS — BP 130/78 | HR 73 | Ht 62.0 in | Wt 149.8 lb

## 2022-02-14 DIAGNOSIS — Z23 Encounter for immunization: Secondary | ICD-10-CM | POA: Diagnosis not present

## 2022-02-14 DIAGNOSIS — J4489 Other specified chronic obstructive pulmonary disease: Secondary | ICD-10-CM | POA: Diagnosis not present

## 2022-02-14 DIAGNOSIS — J302 Other seasonal allergic rhinitis: Secondary | ICD-10-CM

## 2022-02-14 DIAGNOSIS — J3089 Other allergic rhinitis: Secondary | ICD-10-CM | POA: Diagnosis not present

## 2022-02-14 MED ORDER — AZELASTINE HCL 0.1 % NA SOLN: 2.0000 | Freq: Two times a day (BID) | NASAL | 12 refills | Status: DC

## 2022-02-14 MED ORDER — FLUCONAZOLE 150 MG PO TABS: 150.0000 mg | ORAL_TABLET | Freq: Every day | ORAL | 1 refills | Status: DC

## 2022-02-14 MED ORDER — AMOXICILLIN-POT CLAVULANATE 875-125 MG PO TABS: 1.0000 | ORAL_TABLET | Freq: Two times a day (BID) | ORAL | 0 refills | Status: DC

## 2022-02-14 NOTE — Patient Instructions (Addendum)
Order- flu vax senior  Script sent for augmentin and for diflucan  Script sent for azelastine nasal spray  Have a great trip to [place]

## 2022-02-14 NOTE — Assessment & Plan Note (Signed)
Good control.We can continue current meds

## 2022-02-14 NOTE — Assessment & Plan Note (Signed)
Discussed comparison flonase and azelastine Plan- script added for azelastine

## 2022-03-08 ENCOUNTER — Other Ambulatory Visit (HOSPITAL_COMMUNITY): Payer: Self-pay

## 2022-03-09 ENCOUNTER — Other Ambulatory Visit (HOSPITAL_COMMUNITY): Payer: Self-pay

## 2022-03-09 MED ORDER — OZEMPIC (0.25 OR 0.5 MG/DOSE) 2 MG/3ML ~~LOC~~ SOPN
0.2500 mg | PEN_INJECTOR | SUBCUTANEOUS | 2 refills | Status: DC
  Filled 2022-03-09: qty 3, 28d supply, fill #0
  Filled 2022-03-10: qty 3, 42d supply, fill #0
  Filled 2022-03-10: qty 3, 56d supply, fill #0
  Filled 2022-04-03 – 2022-04-10 (×2): qty 3, 42d supply, fill #1
  Filled 2022-05-15 (×2): qty 3, 42d supply, fill #2
  Filled 2022-06-19 – 2022-06-20 (×2): qty 3, 42d supply, fill #3

## 2022-03-10 ENCOUNTER — Other Ambulatory Visit (HOSPITAL_COMMUNITY): Payer: Self-pay

## 2022-03-10 ENCOUNTER — Other Ambulatory Visit (HOSPITAL_BASED_OUTPATIENT_CLINIC_OR_DEPARTMENT_OTHER): Payer: Self-pay

## 2022-03-20 DIAGNOSIS — E1165 Type 2 diabetes mellitus with hyperglycemia: Secondary | ICD-10-CM | POA: Diagnosis not present

## 2022-03-20 DIAGNOSIS — E785 Hyperlipidemia, unspecified: Secondary | ICD-10-CM | POA: Diagnosis not present

## 2022-03-20 DIAGNOSIS — E559 Vitamin D deficiency, unspecified: Secondary | ICD-10-CM | POA: Diagnosis not present

## 2022-03-20 DIAGNOSIS — D508 Other iron deficiency anemias: Secondary | ICD-10-CM | POA: Diagnosis not present

## 2022-03-20 DIAGNOSIS — I10 Essential (primary) hypertension: Secondary | ICD-10-CM | POA: Diagnosis not present

## 2022-03-20 DIAGNOSIS — Z0000 Encounter for general adult medical examination without abnormal findings: Secondary | ICD-10-CM | POA: Diagnosis not present

## 2022-03-20 DIAGNOSIS — R748 Abnormal levels of other serum enzymes: Secondary | ICD-10-CM | POA: Diagnosis not present

## 2022-03-22 DIAGNOSIS — F411 Generalized anxiety disorder: Secondary | ICD-10-CM | POA: Diagnosis not present

## 2022-03-22 DIAGNOSIS — E785 Hyperlipidemia, unspecified: Secondary | ICD-10-CM | POA: Diagnosis not present

## 2022-03-22 DIAGNOSIS — E1165 Type 2 diabetes mellitus with hyperglycemia: Secondary | ICD-10-CM | POA: Diagnosis not present

## 2022-03-22 DIAGNOSIS — I10 Essential (primary) hypertension: Secondary | ICD-10-CM | POA: Diagnosis not present

## 2022-03-22 DIAGNOSIS — Z0000 Encounter for general adult medical examination without abnormal findings: Secondary | ICD-10-CM | POA: Diagnosis not present

## 2022-03-22 DIAGNOSIS — G4700 Insomnia, unspecified: Secondary | ICD-10-CM | POA: Diagnosis not present

## 2022-04-03 ENCOUNTER — Other Ambulatory Visit (HOSPITAL_BASED_OUTPATIENT_CLINIC_OR_DEPARTMENT_OTHER): Payer: Self-pay

## 2022-04-10 ENCOUNTER — Other Ambulatory Visit (HOSPITAL_BASED_OUTPATIENT_CLINIC_OR_DEPARTMENT_OTHER): Payer: Self-pay

## 2022-04-11 ENCOUNTER — Other Ambulatory Visit (HOSPITAL_BASED_OUTPATIENT_CLINIC_OR_DEPARTMENT_OTHER): Payer: Self-pay

## 2022-05-06 ENCOUNTER — Other Ambulatory Visit (HOSPITAL_BASED_OUTPATIENT_CLINIC_OR_DEPARTMENT_OTHER): Payer: Self-pay

## 2022-05-15 ENCOUNTER — Other Ambulatory Visit (HOSPITAL_COMMUNITY): Payer: Self-pay

## 2022-05-15 ENCOUNTER — Other Ambulatory Visit (HOSPITAL_BASED_OUTPATIENT_CLINIC_OR_DEPARTMENT_OTHER): Payer: Self-pay

## 2022-05-16 ENCOUNTER — Other Ambulatory Visit (HOSPITAL_BASED_OUTPATIENT_CLINIC_OR_DEPARTMENT_OTHER): Payer: Self-pay

## 2022-05-17 ENCOUNTER — Other Ambulatory Visit (HOSPITAL_BASED_OUTPATIENT_CLINIC_OR_DEPARTMENT_OTHER): Payer: Self-pay

## 2022-06-19 ENCOUNTER — Ambulatory Visit: Payer: Medicare Other | Attending: Cardiology | Admitting: Cardiology

## 2022-06-19 ENCOUNTER — Other Ambulatory Visit: Payer: Self-pay | Admitting: Family Medicine

## 2022-06-19 ENCOUNTER — Other Ambulatory Visit (HOSPITAL_BASED_OUTPATIENT_CLINIC_OR_DEPARTMENT_OTHER): Payer: Self-pay

## 2022-06-19 ENCOUNTER — Encounter: Payer: Self-pay | Admitting: Cardiology

## 2022-06-19 VITALS — BP 138/80 | HR 68 | Ht 61.5 in | Wt 147.8 lb

## 2022-06-19 DIAGNOSIS — E7800 Pure hypercholesterolemia, unspecified: Secondary | ICD-10-CM | POA: Insufficient documentation

## 2022-06-19 DIAGNOSIS — I452 Bifascicular block: Secondary | ICD-10-CM | POA: Insufficient documentation

## 2022-06-19 DIAGNOSIS — I351 Nonrheumatic aortic (valve) insufficiency: Secondary | ICD-10-CM

## 2022-06-19 DIAGNOSIS — E7841 Elevated Lipoprotein(a): Secondary | ICD-10-CM | POA: Insufficient documentation

## 2022-06-19 DIAGNOSIS — Z0000 Encounter for general adult medical examination without abnormal findings: Secondary | ICD-10-CM

## 2022-06-19 NOTE — Patient Instructions (Signed)
Medication Instructions:  The current medical regimen is effective;  continue present plan and medications.  *If you need a refill on your cardiac medications before your next appointment, please call your pharmacy*  Follow-Up: At [facility], you and your health needs are our priority.  As part of our continuing mission to provide you with exceptional heart care, we have created designated Provider Care Teams.  These Care Teams include your primary Cardiologist (physician) and Advanced Practice Providers (APPs -  Physician Assistants and Nurse Practitioners) who all work together to provide you with the care you need, when you need it.  We recommend signing up for the patient portal called "MyChart".  Sign up information is provided on this After Visit Summary.  MyChart is used to connect with patients for Virtual Visits (Telemedicine).  Patients are able to view lab/test results, encounter notes, upcoming appointments, etc.  Non-urgent messages can be sent to your provider as well.   To learn more about what you can do with MyChart, go to [email or web address].    Your next appointment:   1 year(s)  Provider:   Candee Furbish, MD

## 2022-06-19 NOTE — Progress Notes (Signed)
Cardiology Office Note:    Date:  06/19/2022   ID:  Debbie Norris, DOB 03-01-1951, MRN [id number]  PCP:  Maurice Small, MD   [facility] Providers Cardiologist:  Candee Furbish, MD     Referring MD: Maurice Small, MD    History of Present Illness:    Debbie Norris is a 72 y.o. female here for follow up statin intolerance, now on PCSK9-I, Ozempic.   She has a history of mild mitral and aortic regurgitation per echocardiogram 2020, PVCs, RBBB/left anterior fascicular block/bifascicular block, DM2, HLD with known statin intolerance.    Has tried Livalo, atorvastatin, pravastatin and simvastatin Zetia. Has issues with Crestor 18m   Family history of MI in mother at age 71  Also has a history of gastric bypass surgery in 2006.  Sister had AFIB after anesthesia.   She felt a change in rhythm for a minute the year.  No real chest pain.  No shortness of breath.  No chest pain. Has a few seconds of shortness of breath. Happens in recliner relaxing. Not activity related. Goes up and down stairs OK. Left shoulder pain  Past Medical History:  Diagnosis Date   Abdominal pain    Asthma    Atrophic vaginitis    Cancer [place]    basel cell   Complication of anesthesia    Depression    [place]    High blood pressure    Hx of colonic polyps    Hyperlipidemia    Obesity    GASTRIC BYPASS IN 2006   PONV (postoperative nausea and vomiting)    with inhaled anesthetic    Vitamin D deficiency     Past Surgical History:  Procedure Laterality Date   ABDOMINAL HYSTERECTOMY     BREAST CYST ASPIRATION Right unsure   pt states 30+ years ago   COLONOSCOPY     FACIAL COSMETIC SURGERY     GASTRIC BYPASS     NASAL SINUS SURGERY     TRIGGER FINGER RELEASE Right 04/24/2019   Procedure: RELEASE TRIGGER FINGER/A-1 PULLEY RIGHT INDEX FINGER;  Surgeon: KLeanora Cover MD;  [place]  Service: Orthopedics;  Laterality: Right;    Current  Medications: Current Meds  Medication Sig   albuterol (PROVENTIL HFA) 108 (90 Base) MCG/ACT inhaler TAKE 2 PUFFS BY MOUTH EVERY 4 TO 6 HOURS AS NEEDED   ALPRAZolam (XANAX) 0.5 MG tablet Take 0.5 mg by mouth daily as needed for anxiety.   amoxicillin-clavulanate (AUGMENTIN) 875-125 MG tablet Take 1 tablet by mouth 2 (two) times daily.   azelastine (ASTELIN) 0.1 % nasal spray Place 2 sprays into both nostrils 2 (two) times daily. Use in each nostril as directed   Calcium Carbonate Antacid (TUMS PO) Take 1,000 mg by mouth 3 (three) times daily.   cetirizine (ZYRTEC) 10 MG chewable tablet Chew 10 mg by mouth daily.   Coenzyme Q10 (Q-10 CO-ENZYME PO) Take 300 mg by mouth daily.   cyclobenzaprine (FLEXERIL) 10 MG tablet Take 10 mg by mouth every 8 (eight) hours as needed.   diclofenac sodium (VOLTAREN) 1 % GEL As directed   Evolocumab (REPATHA) 140 MG/ML SOSY 1 mL Subcutaneous for 30 day(s)   Ferrous Sulfate (SLOW FE PO) Take by mouth.   fluconazole (DIFLUCAN) 150 MG tablet Take 1 tablet (150 mg total) by mouth daily.   FLUoxetine (PROZAC) 40 MG capsule Take 40 mg by mouth daily.   fluticasone (FLONASE) 50 MCG/ACT nasal spray Place 2 sprays  into both nostrils 2 (two) times daily.    ONE TOUCH ULTRA TEST test strip 1 each by Other route as needed for other.    OZEMPIC, 0.25 OR 0.5 MG/DOSE, 2 MG/3ML SOPN SMARTSIG:0.25 Milligram(s) SUB-Q Once a Week   pantoprazole (PROTONIX) 40 MG tablet Take 40 mg by mouth 2 (two) times daily at 10 am and 4 pm.    Semaglutide,0.25 or 0.5MG/DOS, (OZEMPIC, 0.25 OR 0.5 MG/DOSE,) 2 MG/3ML SOPN Inject 0.25 mg into the skin once a week.   tolterodine (DETROL LA) 4 MG 24 hr capsule Take 1 capsule by mouth daily.    VAGIFEM 10 MCG TABS vaginal tablet Place 1 tablet vaginally 2 (two) times a week.    vitamin B-12 (CYANOCOBALAMIN) 1000 MCG tablet Take 1,000 mcg by mouth daily.   Vitamin D, Ergocalciferol, (DRISDOL) 50000 UNITS CAPS capsule Take 50,000 Units by mouth daily.  Sundays   zolpidem (AMBIEN) 10 MG tablet Take 10 mg by mouth at bedtime as needed for sleep.      Allergies:   Cream base, Ketorolac tromethamine, Pineapple, Sevoflurane, Atorvastatin, Benicar [olmesartan], Lisinopril, Metformin and related, Other, Phenergan [promethazine hcl], and Valdecoxib   Social History   Socioeconomic History   Marital status: Married    Spouse name: Not on file   Number of children: Not on file   Years of education: Not on file   Highest education level: Not on file  Occupational History   Not on file  Tobacco Use   Smoking status: Never   Smokeless tobacco: Never  Vaping Use   Vaping Use: Never used  Substance and Sexual Activity   Alcohol use: Yes    Comment: social   Drug use: No   Sexual activity: Not on file  Other Topics Concern   Not on file  Social History Narrative   Not on file   Social Determinants of Health   Financial Resource Strain: Not on file  Food Insecurity: Not on file  Transportation Needs: Not on file  Physical Activity: Not on file  Stress: Not on file  Social Connections: Not on file     Family History: The patient's family history includes Cancer in her father; Emphysema in her paternal aunt; Heart disease in her father and mother.  ROS:   Please see the history of present illness.     All other systems reviewed and are negative.  EKGs/Labs/Other Studies Reviewed:    The following studies were reviewed today: Echocardiogram 06/21/2018:   1. The left ventricle has normal systolic function with an ejection  fraction of 60-65%. The cavity size was normal. There is mildly increased  left ventricular wall thickness. Left ventricular diastolic Doppler  parameters are consistent with impaired  relaxation No evidence of left ventricular regional wall motion  abnormalities.   2. The right ventricle has normal systolic function. The cavity was  normal. There is no increase in right ventricular wall thickness.   3. The  mitral valve is normal in structure. There is moderate mitral  annular calcification present. No evidence of mitral valve stenosis.  Trivial regurgitation.   4. The tricuspid valve is normal in structure.   5. The aortic valve is tricuspid Mild calcification of the aortic valve.  no stenosis of the aortic valve.   6. The pulmonic valve was normal in structure.   7. Right atrial pressure is estimated at 3 mmHg.   8. No complete TR doppler jet so unable to estimate PA systolic pressure.  EKG:   06/19/22: NSR 68, LAFB Prior SR 67 RBBB LAFB  Recent Labs: 10/31/2021: ALT 28  Recent Lipid Panel    Component Value Date/Time   CHOL 193 10/31/2021 1153   TRIG 111 10/31/2021 1153   HDL 111 10/31/2021 1153   CHOLHDL 1.7 10/31/2021 1153   LDLCALC 63 10/31/2021 1153     Risk Assessment/Calculations:              Physical Exam:    VS:  BP 138/80   Pulse 68   Ht 5' 1.5" (1.562 m)   Wt 147 lb 12.8 oz (67 kg)   SpO2 98%   BMI 27.47 kg/m     Wt Readings from Last 3 Encounters:  06/19/22 147 lb 12.8 oz (67 kg)  02/14/22 149 lb 12.8 oz (67.9 kg)  06/21/21 166 lb (75.3 kg)     GEN:  Well nourished, well developed in no acute distress HEENT: Normal NECK: No JVD; No carotid bruits LYMPHATICS: No lymphadenopathy CARDIAC: RRR, no murmurs, no rubs, gallops RESPIRATORY:  Clear to auscultation without rales, wheezing or rhonchi  ABDOMEN: Soft, non-tender, non-distended MUSCULOSKELETAL:  No edema; No deformity  SKIN: Warm and dry NEUROLOGIC:  Alert and oriented x 3 PSYCHIATRIC:  Normal affect   ASSESSMENT:    1. Nonrheumatic aortic valve insufficiency   2. Pure hypercholesterolemia   3. Bifascicular block   4. Elevated lipoprotein(a)     PLAN:    In order of problems listed above:   Bifascicular block No pacemaker needs at this point.  May evolve to that.  Stable, no high risk symptoms such as syncope.  We discussed again today.  Nonrheumatic aortic valve  insufficiency Previously trace to mild. Reassuring.   PVC (premature ventricular contraction) Stable. No complaints.  She did mention that once during the year she felt a slightly irregular rhythm.  Continue to monitor.  Her sister did have atrial fibrillation surrounding surgery.  No PVC noted on EKG today.  Pure hypercholesterolemia With elevated coronary calcium score in the 80s, she is doing well on PCSK9 inhibitor.   Coronary calcification Continue with PCSK9 inhibitor.  LDL cholesterol is less than 70 now.  I see her husband as well.      Medication Adjustments/Labs and Tests Ordered: Current medicines are reviewed at length with the patient today.  Concerns regarding medicines are outlined above.  Orders Placed This Encounter  Procedures   EKG 12-Lead   No orders of the defined types were placed in this encounter.   Patient Instructions  Medication Instructions:  The current medical regimen is effective;  continue present plan and medications.  *If you need a refill on your cardiac medications before your next appointment, please call your pharmacy*  Follow-Up: At [facility], you and your health needs are our priority.  As part of our continuing mission to provide you with exceptional heart care, we have created designated Provider Care Teams.  These Care Teams include your primary Cardiologist (physician) and Advanced Practice Providers (APPs -  Physician Assistants and Nurse Practitioners) who all work together to provide you with the care you need, when you need it.  We recommend signing up for the patient portal called "MyChart".  Sign up information is provided on this After Visit Summary.  MyChart is used to connect with patients for Virtual Visits (Telemedicine).  Patients are able to view lab/test results, encounter notes, upcoming appointments, etc.  Non-urgent messages can be sent to your provider as well.  To learn more about what you can do with  MyChart, go to [email or web address].    Your next appointment:   1 year(s)  Provider:   Candee Furbish, MD        Signed, Candee Furbish, MD  06/19/2022 5:18 PM    [place]

## 2022-06-20 ENCOUNTER — Other Ambulatory Visit (HOSPITAL_BASED_OUTPATIENT_CLINIC_OR_DEPARTMENT_OTHER): Payer: Self-pay

## 2022-06-22 ENCOUNTER — Other Ambulatory Visit (HOSPITAL_BASED_OUTPATIENT_CLINIC_OR_DEPARTMENT_OTHER): Payer: Self-pay

## 2022-06-22 DIAGNOSIS — F411 Generalized anxiety disorder: Secondary | ICD-10-CM | POA: Diagnosis not present

## 2022-06-22 DIAGNOSIS — G4700 Insomnia, unspecified: Secondary | ICD-10-CM | POA: Diagnosis not present

## 2022-06-22 DIAGNOSIS — E785 Hyperlipidemia, unspecified: Secondary | ICD-10-CM | POA: Diagnosis not present

## 2022-06-22 DIAGNOSIS — F330 Major depressive disorder, recurrent, mild: Secondary | ICD-10-CM | POA: Diagnosis not present

## 2022-06-22 DIAGNOSIS — I10 Essential (primary) hypertension: Secondary | ICD-10-CM | POA: Diagnosis not present

## 2022-06-22 DIAGNOSIS — E1165 Type 2 diabetes mellitus with hyperglycemia: Secondary | ICD-10-CM | POA: Diagnosis not present

## 2022-06-22 MED ORDER — OZEMPIC (0.25 OR 0.5 MG/DOSE) 2 MG/3ML ~~LOC~~ SOPN
0.5000 mg | PEN_INJECTOR | SUBCUTANEOUS | 4 refills | Status: DC
  Filled 2022-06-22 – 2022-06-29 (×2): qty 3, 28d supply, fill #0

## 2022-06-28 ENCOUNTER — Other Ambulatory Visit (HOSPITAL_BASED_OUTPATIENT_CLINIC_OR_DEPARTMENT_OTHER): Payer: Self-pay

## 2022-06-28 MED ORDER — ZOLPIDEM TARTRATE 10 MG PO TABS
5.0000 mg | ORAL_TABLET | Freq: Every evening | ORAL | 0 refills | Status: DC | PRN
  Filled 2022-06-30: qty 90, 90d supply, fill #0

## 2022-06-29 ENCOUNTER — Other Ambulatory Visit (HOSPITAL_BASED_OUTPATIENT_CLINIC_OR_DEPARTMENT_OTHER): Payer: Self-pay

## 2022-06-30 ENCOUNTER — Other Ambulatory Visit (HOSPITAL_BASED_OUTPATIENT_CLINIC_OR_DEPARTMENT_OTHER): Payer: Self-pay

## 2022-06-30 MED ORDER — OZEMPIC (0.25 OR 0.5 MG/DOSE) 2 MG/3ML ~~LOC~~ SOPN
0.5000 mg | PEN_INJECTOR | SUBCUTANEOUS | 3 refills | Status: DC
  Filled 2022-06-30 – 2022-07-24 (×2): qty 9, 84d supply, fill #0
  Filled 2022-10-27: qty 9, 84d supply, fill #1
  Filled 2022-12-20: qty 9, 84d supply, fill #2
  Filled 2022-12-25: qty 3, 28d supply, fill #2
  Filled 2023-01-18: qty 9, 84d supply, fill #3

## 2022-07-11 DIAGNOSIS — E119 Type 2 diabetes mellitus without complications: Secondary | ICD-10-CM | POA: Diagnosis not present

## 2022-07-11 DIAGNOSIS — H52202 Unspecified astigmatism, left eye: Secondary | ICD-10-CM | POA: Diagnosis not present

## 2022-07-11 DIAGNOSIS — H1789 Other corneal scars and opacities: Secondary | ICD-10-CM | POA: Diagnosis not present

## 2022-07-13 DIAGNOSIS — L719 Rosacea, unspecified: Secondary | ICD-10-CM | POA: Diagnosis not present

## 2022-07-13 DIAGNOSIS — Z85828 Personal history of other malignant neoplasm of skin: Secondary | ICD-10-CM | POA: Diagnosis not present

## 2022-07-13 DIAGNOSIS — L720 Epidermal cyst: Secondary | ICD-10-CM | POA: Diagnosis not present

## 2022-07-13 DIAGNOSIS — L570 Actinic keratosis: Secondary | ICD-10-CM | POA: Diagnosis not present

## 2022-07-13 DIAGNOSIS — L814 Other melanin hyperpigmentation: Secondary | ICD-10-CM | POA: Diagnosis not present

## 2022-07-13 DIAGNOSIS — L578 Other skin changes due to chronic exposure to nonionizing radiation: Secondary | ICD-10-CM | POA: Diagnosis not present

## 2022-07-13 DIAGNOSIS — L821 Other seborrheic keratosis: Secondary | ICD-10-CM | POA: Diagnosis not present

## 2022-07-18 ENCOUNTER — Other Ambulatory Visit (HOSPITAL_BASED_OUTPATIENT_CLINIC_OR_DEPARTMENT_OTHER): Payer: Self-pay

## 2022-07-18 MED ORDER — HYDROXYZINE HCL 50 MG PO TABS
25.0000 mg | ORAL_TABLET | Freq: Two times a day (BID) | ORAL | 0 refills | Status: DC | PRN
  Filled 2022-07-18: qty 20, 10d supply, fill #0

## 2022-07-18 MED ORDER — ALPRAZOLAM 0.5 MG PO TABS
0.5000 mg | ORAL_TABLET | Freq: Three times a day (TID) | ORAL | 0 refills | Status: DC | PRN
  Filled 2022-07-18: qty 90, 30d supply, fill #0

## 2022-07-20 ENCOUNTER — Ambulatory Visit (HOSPITAL_BASED_OUTPATIENT_CLINIC_OR_DEPARTMENT_OTHER): Payer: Medicare Other | Admitting: Family Medicine

## 2022-07-21 ENCOUNTER — Encounter: Payer: Self-pay | Admitting: Behavioral Health

## 2022-07-21 ENCOUNTER — Ambulatory Visit (INDEPENDENT_AMBULATORY_CARE_PROVIDER_SITE_OTHER): Payer: Medicare Other | Admitting: Behavioral Health

## 2022-07-21 VITALS — BP 148/64 | HR 77 | Ht 61.0 in | Wt 146.0 lb

## 2022-07-21 DIAGNOSIS — F5105 Insomnia due to other mental disorder: Secondary | ICD-10-CM

## 2022-07-21 DIAGNOSIS — F331 Major depressive disorder, recurrent, moderate: Secondary | ICD-10-CM | POA: Diagnosis not present

## 2022-07-21 DIAGNOSIS — F410 Panic disorder [episodic paroxysmal anxiety] without agoraphobia: Secondary | ICD-10-CM | POA: Diagnosis not present

## 2022-07-21 DIAGNOSIS — F99 Mental disorder, not otherwise specified: Secondary | ICD-10-CM | POA: Diagnosis not present

## 2022-07-21 DIAGNOSIS — F411 Generalized anxiety disorder: Secondary | ICD-10-CM

## 2022-07-21 MED ORDER — FLUOXETINE HCL 20 MG PO CAPS: 20.0000 mg | ORAL_CAPSULE | Freq: Every day | ORAL | 1 refills | Status: DC

## 2022-07-21 MED ORDER — HYDROXYZINE HCL 25 MG PO TABS: 25.0000 mg | ORAL_TABLET | Freq: Three times a day (TID) | ORAL | 0 refills | Status: DC | PRN

## 2022-07-21 MED ORDER — CLONIDINE HCL 0.1 MG PO TABS: ORAL_TABLET | ORAL | 1 refills | Status: DC
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
## 2022-07-22 ENCOUNTER — Other Ambulatory Visit: Payer: Self-pay | Admitting: Cardiology

## 2022-07-22 DIAGNOSIS — E7841 Elevated Lipoprotein(a): Secondary | ICD-10-CM

## 2022-07-22 DIAGNOSIS — E7800 Pure hypercholesterolemia, unspecified: Secondary | ICD-10-CM

## 2022-07-24 ENCOUNTER — Other Ambulatory Visit (HOSPITAL_BASED_OUTPATIENT_CLINIC_OR_DEPARTMENT_OTHER): Payer: Self-pay

## 2022-07-28 ENCOUNTER — Encounter: Payer: Self-pay | Admitting: Pharmacist

## 2022-08-02 ENCOUNTER — Other Ambulatory Visit: Payer: Self-pay | Admitting: Behavioral Health

## 2022-08-02 ENCOUNTER — Other Ambulatory Visit (HOSPITAL_BASED_OUTPATIENT_CLINIC_OR_DEPARTMENT_OTHER): Payer: Self-pay

## 2022-08-02 DIAGNOSIS — F411 Generalized anxiety disorder: Secondary | ICD-10-CM

## 2022-08-03 ENCOUNTER — Ambulatory Visit: Payer: Medicare Other | Admitting: Behavioral Health

## 2022-08-09 ENCOUNTER — Telehealth: Payer: Self-pay | Admitting: Behavioral Health

## 2022-08-09 ENCOUNTER — Encounter: Payer: Self-pay | Admitting: Behavioral Health

## 2022-08-09 ENCOUNTER — Ambulatory Visit (INDEPENDENT_AMBULATORY_CARE_PROVIDER_SITE_OTHER): Payer: Medicare Other | Admitting: Behavioral Health

## 2022-08-09 DIAGNOSIS — F411 Generalized anxiety disorder: Secondary | ICD-10-CM

## 2022-08-09 DIAGNOSIS — F331 Major depressive disorder, recurrent, moderate: Secondary | ICD-10-CM | POA: Diagnosis not present

## 2022-08-09 DIAGNOSIS — F5105 Insomnia due to other mental disorder: Secondary | ICD-10-CM | POA: Diagnosis not present

## 2022-08-09 DIAGNOSIS — F99 Mental disorder, not otherwise specified: Secondary | ICD-10-CM

## 2022-08-09 MED ORDER — QUETIAPINE FUMARATE 25 MG PO TABS: 25.0000 mg | ORAL_TABLET | Freq: Every day | ORAL | 1 refills | Status: DC

## 2022-08-09 MED ORDER — HYDROXYZINE HCL 50 MG PO TABS: 25.0000 mg | ORAL_TABLET | Freq: Two times a day (BID) | ORAL | 1 refills | Status: DC | PRN

## 2022-08-09 NOTE — Telephone Encounter (Signed)
[facility] sent PA request for HYDROXYZINE hcl tab , form to complete.

## 2022-08-09 NOTE — Progress Notes (Signed)
[facility]  Patient ID: Debbie Norris,  MRN: [id number]  PCP: Maurice Small, MD  Date of Evaluation: 08/09/2022 Time spent:30 minutes  Chief Complaint:  Chief Complaint   Anxiety; Depression; Follow-up; Medication Problem; Patient Education; Stress     HISTORY/CURRENT STATUS: HPI  "Debbie Norris", 72 year old female presents to this for follow up and medication management.  Collateral information should be considered reliable. Says that she has noticed minimal improvement since last visit but understands that it has not been long enough for the increase prozac to work. Says she is still not sleeping well. Wakes up with racing thoughts.  She rates her anxiety today 6/10, and depression at 6/10.  She is sleeping 6 to 7 hours per night.  She denies mania, no psychosis, no auditory or visual hallucinations.  She does endorse to alcoholic beverages per evening.  Usually 2 beers.  Says that she consumes at the same time every day usually around dinnertime.  She denies SI or HI.   Prior psychiatric medication trials: Prozac Hydroxyzine Ambien Xanax      Individual Medical History/ Review of Systems: Changes? :No   Allergies: Cream base, Ketorolac tromethamine, Pineapple, Sevoflurane, Atorvastatin, Benicar [olmesartan], Lisinopril, Metformin and related, Other, Phenergan [promethazine hcl], and Valdecoxib  Current Medications:  Current Outpatient Medications:    albuterol (PROVENTIL HFA) 108 (90 Base) MCG/ACT inhaler, TAKE 2 PUFFS BY MOUTH EVERY 4 TO 6 HOURS AS NEEDED, Disp: 6.7 each, Rfl: 11   ALPRAZolam (XANAX) 0.5 MG tablet, Take 1 tablet (0.5 mg total) by mouth 3 (three) times daily as needed for anxiety., Disp: 90 tablet, Rfl: 0   amoxicillin-clavulanate (AUGMENTIN) 875-125 MG tablet, Take 1 tablet by mouth 2 (two) times daily., Disp: 14 tablet, Rfl: 0   azelastine (ASTELIN) 0.1 % nasal spray, Place 2 sprays into both nostrils 2 (two) times daily. Use in each nostril as  directed, Disp: 30 mL, Rfl: 12   Calcium Carbonate Antacid (TUMS PO), Take 1,000 mg by mouth 3 (three) times daily., Disp: , Rfl:    cetirizine (ZYRTEC) 10 MG chewable tablet, Chew 10 mg by mouth daily., Disp: , Rfl:    cloNIDine (CATAPRES) 0.1 MG tablet, Take one tablet  by mouth at bedtime and then one tablet in the morning when wakening., Disp: 60 tablet, Rfl: 1   Coenzyme Q10 (Q-10 CO-ENZYME PO), Take 300 mg by mouth daily., Disp: , Rfl:    cyclobenzaprine (FLEXERIL) 10 MG tablet, Take 10 mg by mouth every 8 (eight) hours as needed., Disp: , Rfl:    diclofenac sodium (VOLTAREN) 1 % GEL, As directed, Disp: , Rfl: 1   Evolocumab (REPATHA SURECLICK) [id number] MG/ML SOAJ, INJECT 1 PEN. INTO THE SKIN EVERY 14 (FOURTEEN) DAYS., Disp: 6 mL, Rfl: 1   Ferrous Sulfate (SLOW FE PO), Take by mouth., Disp: , Rfl:    fluconazole (DIFLUCAN) 150 MG tablet, Take 1 tablet (150 mg total) by mouth daily., Disp: 7 tablet, Rfl: 1   FLUoxetine (PROZAC) 20 MG capsule, Take 1 capsule (20 mg total) by mouth daily., Disp: 30 capsule, Rfl: 1   FLUoxetine (PROZAC) 40 MG capsule, Take 40 mg by mouth daily., Disp: , Rfl:    fluticasone (FLONASE) 50 MCG/ACT nasal spray, Place 2 sprays into both nostrils 2 (two) times daily. , Disp: , Rfl:    hydrOXYzine (ATARAX) 25 MG tablet, TAKE 1 TABLET BY MOUTH THREE TIMES A DAY AS NEEDED, Disp: 30 tablet, Rfl: 0   hydrOXYzine (ATARAX) 50 MG tablet, Take 0.5-1  tablets (25-50 mg total) by mouth 2 (two) times daily as needed for anxiety., Disp: 20 tablet, Rfl: 0   ONE TOUCH ULTRA TEST test strip, 1 each by Other route as needed for other. , Disp: , Rfl: 3   OZEMPIC, 0.25 OR 0.5 MG/DOSE, 2 MG/3ML SOPN, SMARTSIG:0.25 Milligram(s) SUB-Q Once a Week, Disp: , Rfl:    pantoprazole (PROTONIX) 40 MG tablet, Take 40 mg by mouth 2 (two) times daily at 10 am and 4 pm. , Disp: , Rfl:    Semaglutide,0.25 or 0.5MG /DOS, (OZEMPIC, 0.25 OR 0.5 MG/DOSE,) 2 MG/3ML SOPN, Inject 0.25 mg into the skin once a week.,  Disp: 9 mL, Rfl: 2   Semaglutide,0.25 or 0.5MG /DOS, (OZEMPIC, 0.25 OR 0.5 MG/DOSE,) 2 MG/3ML SOPN, Inject 0.5 mg into the skin every 7 (seven) days., Disp: 3 mL, Rfl: 4   Semaglutide,0.25 or 0.5MG /DOS, (OZEMPIC, 0.25 OR 0.5 MG/DOSE,) 2 MG/3ML SOPN, Inject 0.5 mg into the skin once a week., Disp: 9 mL, Rfl: 3   tolterodine (DETROL LA) 4 MG 24 hr capsule, Take 1 capsule by mouth daily. , Disp: , Rfl:    VAGIFEM 10 MCG TABS vaginal tablet, Place 1 tablet vaginally 2 (two) times a week. , Disp: , Rfl: 3   vitamin B-12 (CYANOCOBALAMIN) 1000 MCG tablet, Take 1,000 mcg by mouth daily., Disp: , Rfl:    Vitamin D, Ergocalciferol, (DRISDOL) 50000 UNITS CAPS capsule, Take 50,000 Units by mouth daily. Sundays, Disp: , Rfl:    zolpidem (AMBIEN) 10 MG tablet, Take 10 mg by mouth at bedtime as needed for sleep. , Disp: , Rfl:    zolpidem (AMBIEN) 10 MG tablet, Take 0.5-1 tablets (5-10 mg total) by mouth at bedtime as needed., Disp: 90 tablet, Rfl: 0 Medication Side Effects: none  Family Medical/ Social History: Changes? No  MENTAL HEALTH EXAM:  There were no vitals taken for this visit.There is no height or weight on file to calculate BMI.  General Appearance: Casual, Neat, and Well Groomed  Eye Contact:  Good  Speech:  Clear and Coherent  Volume:  Normal  Mood:  Anxious, Depressed, and Dysphoric  Affect:  Appropriate  Thought Process:  Coherent  Orientation:  Full (Time, Place, and Person)  Thought Content: Logical   Suicidal Thoughts:  No  Homicidal Thoughts:  No  Memory:  WNL  Judgement:  Good  Insight:  Good  Psychomotor Activity:  Normal  Concentration:  Concentration: Good  Recall:  Good  Fund of Knowledge: Good  Language: Good  Assets:  Desire for Improvement  ADL's:  Intact  Cognition: WNL  Prognosis:  Good    DIAGNOSES:    ICD-10-CM   1. Generalized anxiety disorder  F41.1     2. Insomnia due to other mental disorder  F51.05    F99     3. Major depressive disorder, recurrent  episode, moderate  F33.1       Receiving Psychotherapy: No    RECOMMENDATIONS:  Greater than 50% of 30 min face to face time with patient was spent on counseling and coordination of care.  We discussed her continuing problems with anxiety and depression and most recently agitation. I explained that the increased dose of Prozac has not had enough time to work and I also cautioned her on taking more xanax than prescribed. She is asking for additional help for sleep.  We agreed to: To continue Prozac to 60 mg daily in the a.m. To continue Xanax 0.5 mg 3 times daily as needed  only for severe anxiety.  We agreed to reassess and possibly reduce to twice daily in 1 month depending on her condition. Will start Seroquel 25 mg at bedtime. May increase to 50 mg if not effective. To continue hydroxyzine 25 mg 3 times daily as needed To continue clonidine 0.1 mg at bedtime, and then another 0.1 mg in the morning when awakening. Reduce Ambien to 5 mg at bedtime Will report side effects or worsening symptoms Provided emergency contact information Reviewed [place], NP

## 2022-08-11 NOTE — Telephone Encounter (Signed)
Faxed additional info to insurance today

## 2022-08-12 ENCOUNTER — Other Ambulatory Visit: Payer: Self-pay | Admitting: Behavioral Health

## 2022-08-12 DIAGNOSIS — F5105 Insomnia due to other mental disorder: Secondary | ICD-10-CM

## 2022-08-12 DIAGNOSIS — F411 Generalized anxiety disorder: Secondary | ICD-10-CM

## 2022-08-12 DIAGNOSIS — F331 Major depressive disorder, recurrent, moderate: Secondary | ICD-10-CM

## 2022-08-12 DIAGNOSIS — F410 Panic disorder [episodic paroxysmal anxiety] without agoraphobia: Secondary | ICD-10-CM

## 2022-08-14 NOTE — Telephone Encounter (Signed)
Form was completed and faxed back

## 2022-08-15 ENCOUNTER — Ambulatory Visit: Payer: Self-pay | Admitting: Adult Health

## 2022-08-16 ENCOUNTER — Telehealth: Payer: Self-pay | Admitting: Behavioral Health

## 2022-08-16 ENCOUNTER — Other Ambulatory Visit: Payer: Self-pay | Admitting: Behavioral Health

## 2022-08-16 MED ORDER — ALPRAZOLAM 0.5 MG PO TABS: 0.5000 mg | ORAL_TABLET | Freq: Three times a day (TID) | ORAL | 0 refills | Status: DC | PRN

## 2022-08-16 NOTE — Telephone Encounter (Signed)
Pended.

## 2022-08-16 NOTE — Telephone Encounter (Signed)
[employer] approved HYDROXYZINE hcl tab  05/08/22-05/08/23)

## 2022-08-16 NOTE — Telephone Encounter (Signed)
Next visit is 09/18/22, Marylu Lund called and saw Debbie Norris recently. She was given Alprazolam to start 4/day but it was dropped to 3/day. She has 2 pills left due to taking more.Requesting refill sent to:  [id number] [place], [place] - [address]   Phone: [phone]  Fax: [phone]    She was put on Quetiapine 1-2 pills and said she is not sleeping good but is taking 3-4 hr naps. Her phone number is [phone].

## 2022-08-17 ENCOUNTER — Ambulatory Visit: Payer: Medicare Other | Admitting: Behavioral Health

## 2022-08-22 ENCOUNTER — Ambulatory Visit
Admission: RE | Admit: 2022-08-22 | Discharge: 2022-08-22 | Disposition: A | Discharge status: Home / Self Care | Payer: Medicare Other | Source: Ambulatory Visit | Attending: Family Medicine | Admitting: Family Medicine

## 2022-08-22 DIAGNOSIS — Z0000 Encounter for general adult medical examination without abnormal findings: Secondary | ICD-10-CM

## 2022-08-22 DIAGNOSIS — Z1231 Encounter for screening mammogram for malignant neoplasm of breast: Secondary | ICD-10-CM | POA: Diagnosis not present

## 2022-08-23 ENCOUNTER — Ambulatory Visit: Payer: Medicare Other | Admitting: Behavioral Health

## 2022-08-29 ENCOUNTER — Ambulatory Visit: Payer: Medicare Other | Admitting: Behavioral Health

## 2022-08-29 ENCOUNTER — Ambulatory Visit (INDEPENDENT_AMBULATORY_CARE_PROVIDER_SITE_OTHER): Payer: Medicare Other | Admitting: Behavioral Health

## 2022-08-29 ENCOUNTER — Encounter: Payer: Self-pay | Admitting: Behavioral Health

## 2022-08-29 DIAGNOSIS — F99 Mental disorder, not otherwise specified: Secondary | ICD-10-CM | POA: Diagnosis not present

## 2022-08-29 DIAGNOSIS — F411 Generalized anxiety disorder: Secondary | ICD-10-CM

## 2022-08-29 DIAGNOSIS — F410 Panic disorder [episodic paroxysmal anxiety] without agoraphobia: Secondary | ICD-10-CM | POA: Diagnosis not present

## 2022-08-29 DIAGNOSIS — F5105 Insomnia due to other mental disorder: Secondary | ICD-10-CM | POA: Diagnosis not present

## 2022-08-29 DIAGNOSIS — F331 Major depressive disorder, recurrent, moderate: Secondary | ICD-10-CM | POA: Diagnosis not present

## 2022-08-29 MED ORDER — FLUOXETINE HCL 20 MG PO CAPS: 20.0000 mg | ORAL_CAPSULE | Freq: Every day | ORAL | 1 refills | Status: DC

## 2022-08-29 MED ORDER — ALPRAZOLAM 0.5 MG PO TABS: 0.5000 mg | ORAL_TABLET | Freq: Three times a day (TID) | ORAL | 1 refills | Status: DC | PRN

## 2022-08-29 MED ORDER — ZOLPIDEM TARTRATE 10 MG PO TABS: 10.0000 mg | ORAL_TABLET | Freq: Every evening | ORAL | 2 refills | Status: DC | PRN

## 2022-08-29 MED ORDER — HYDROXYZINE HCL 25 MG PO TABS: 25.0000 mg | ORAL_TABLET | Freq: Three times a day (TID) | ORAL | 2 refills | Status: DC | PRN

## 2022-08-29 MED ORDER — CLONIDINE HCL 0.1 MG PO TABS: ORAL_TABLET | ORAL | 2 refills | Status: DC

## 2022-08-29 NOTE — Progress Notes (Addendum)
[facility]  Patient ID: Debbie Norris,  MRN: [id number]  PCP: Debbie Mylar, MD  Date of Evaluation: 08/29/2022 Time spent:30 minutes  Chief Complaint:  Chief Complaint   Anxiety; Depression; Follow-up; Patient Education; Medication Refill; Medication Problem     HISTORY/CURRENT STATUS: HPI  "Debbie Norris", 71 year old female presents to this for follow up and medication management.  Collateral information should be considered reliable. Says that she has some moderate improvement with anxiety and sleep since last visit. She feels like adding Seroquel has been beneficial.  She is now getting average of 6-7 hours per night. She rates her anxiety today 4/10, and depression at 3/10.  She denies mania, no psychosis, no auditory or visual hallucinations.  She does endorse to alcoholic beverages per evening.  Usually 2 beers.  Says that she consumes at the same time every day usually around dinnertime.  She denies SI or HI.   Prior psychiatric medication trials: Prozac Hydroxyzine Ambien Xanax     Individual Medical History/ Review of Systems: Changes? :No   Allergies: Cream base, Ketorolac tromethamine, Pineapple, Sevoflurane, Atorvastatin, Benicar [olmesartan], Lisinopril, Metformin and related, Other, Phenergan [promethazine hcl], and Valdecoxib  Current Medications:  Current Outpatient Medications:    albuterol (PROVENTIL HFA) 108 (90 Base) MCG/ACT inhaler, TAKE 2 PUFFS BY MOUTH EVERY 4 TO 6 HOURS AS NEEDED, Disp: 6.7 each, Rfl: 11   ALPRAZolam (XANAX) 0.5 MG tablet, Take 1 tablet (0.5 mg total) by mouth 3 (three) times daily as needed for anxiety., Disp: 90 tablet, Rfl: 1   amoxicillin-clavulanate (AUGMENTIN) 875-125 MG tablet, Take 1 tablet by mouth 2 (two) times daily., Disp: 14 tablet, Rfl: 0   azelastine (ASTELIN) 0.1 % nasal spray, Place 2 sprays into both nostrils 2 (two) times daily. Use in each nostril as directed, Disp: 30 mL, Rfl: 12   Calcium Carbonate  Antacid (TUMS PO), Take 1,000 mg by mouth 3 (three) times daily., Disp: , Rfl:    cetirizine (ZYRTEC) 10 MG chewable tablet, Chew 10 mg by mouth daily., Disp: , Rfl:    cloNIDine (CATAPRES) 0.1 MG tablet, Take one tablet  by mouth at bedtime and then one tablet in the morning when wakening., Disp: 60 tablet, Rfl: 2   Coenzyme Q10 (Q-10 CO-ENZYME PO), Take 300 mg by mouth daily., Disp: , Rfl:    cyclobenzaprine (FLEXERIL) 10 MG tablet, Take 10 mg by mouth every 8 (eight) hours as needed., Disp: , Rfl:    diclofenac sodium (VOLTAREN) 1 % GEL, As directed, Disp: , Rfl: 1   Evolocumab (REPATHA SURECLICK) 140 MG/ML SOAJ, INJECT 1 PEN. INTO THE SKIN EVERY 14 (FOURTEEN) DAYS., Disp: 6 mL, Rfl: 1   Ferrous Sulfate (SLOW FE PO), Take by mouth., Disp: , Rfl:    fluconazole (DIFLUCAN) 150 MG tablet, Take 1 tablet (150 mg total) by mouth daily., Disp: 7 tablet, Rfl: 1   FLUoxetine (PROZAC) 20 MG capsule, Take 1 capsule (20 mg total) by mouth daily., Disp: 90 capsule, Rfl: 1   FLUoxetine (PROZAC) 40 MG capsule, Take 40 mg by mouth daily., Disp: , Rfl:    fluticasone (FLONASE) 50 MCG/ACT nasal spray, Place 2 sprays into both nostrils 2 (two) times daily. , Disp: , Rfl:    hydrOXYzine (ATARAX) 25 MG tablet, Take 1 tablet (25 mg total) by mouth 3 (three) times daily as needed., Disp: 90 tablet, Rfl: 2   hydrOXYzine (ATARAX) 50 MG tablet, Take 0.5-1 tablets (25-50 mg total) by mouth 2 (two) times daily as needed  for anxiety., Disp: 60 tablet, Rfl: 1   OZEMPIC, 0.25 OR 0.5 MG/DOSE, 2 MG/3ML SOPN, SMARTSIG:0.25 Milligram(s) SUB-Q Once a Week, Disp: , Rfl:    pantoprazole (PROTONIX) 40 MG tablet, Take 40 mg by mouth 2 (two) times daily at 10 am and 4 pm. , Disp: , Rfl:    QUEtiapine (SEROQUEL) 25 MG tablet, Take 1 tablet (25 mg total) by mouth at bedtime., Disp: 60 tablet, Rfl: 1   Semaglutide,0.25 or 0.5MG /DOS, (OZEMPIC, 0.25 OR 0.5 MG/DOSE,) 2 MG/3ML SOPN, Inject 0.25 mg into the skin once a week., Disp: 9 mL, Rfl:  2   Semaglutide,0.25 or 0.5MG /DOS, (OZEMPIC, 0.25 OR 0.5 MG/DOSE,) 2 MG/3ML SOPN, Inject 0.5 mg into the skin every 7 (seven) days., Disp: 3 mL, Rfl: 4   Semaglutide,0.25 or 0.5MG /DOS, (OZEMPIC, 0.25 OR 0.5 MG/DOSE,) 2 MG/3ML SOPN, Inject 0.5 mg into the skin once a week., Disp: 9 mL, Rfl: 3   VAGIFEM 10 MCG TABS vaginal tablet, Place 1 tablet vaginally 2 (two) times a week. , Disp: , Rfl: 3   vitamin B-12 (CYANOCOBALAMIN) 1000 MCG tablet, Take 1,000 mcg by mouth daily., Disp: , Rfl:    Vitamin D, Ergocalciferol, (DRISDOL) 50000 UNITS CAPS capsule, Take 50,000 Units by mouth daily. Sundays, Disp: , Rfl:    zolpidem (AMBIEN) 10 MG tablet, Take 0.5-1 tablets (5-10 mg total) by mouth at bedtime as needed., Disp: 90 tablet, Rfl: 0   zolpidem (AMBIEN) 10 MG tablet, Take 1 tablet (10 mg total) by mouth at bedtime as needed for sleep., Disp: 30 tablet, Rfl: 2 Medication Side Effects: none  Family Medical/ Social History: Changes? No  MENTAL HEALTH EXAM:  There were no vitals taken for this visit.There is no height or weight on file to calculate BMI.  General Appearance: Casual, Neat, and Well Groomed  Eye Contact:  Good  Speech:  Clear and Coherent  Volume:  Normal  Mood:  Anxious, Depressed, and Dysphoric  Affect:  Appropriate and Anxious  Thought Process:  Coherent  Orientation:  Full (Time, Place, and Person)  Thought Content: Logical   Suicidal Thoughts:  No  Homicidal Thoughts:  No  Memory:  WNL  Judgement:  Good  Insight:  Good  Psychomotor Activity:  Normal  Concentration:  Concentration: Good  Recall:  Good  Fund of Knowledge: Good  Language: Good  Assets:  Desire for Improvement  ADL's:  Intact  Cognition: WNL  Prognosis:  Good    DIAGNOSES:    ICD-10-CM   1. Generalized anxiety disorder  F41.1 cloNIDine (CATAPRES) 0.1 MG tablet    hydrOXYzine (ATARAX) 25 MG tablet    ALPRAZolam (XANAX) 0.5 MG tablet    FLUoxetine (PROZAC) 20 MG capsule    2. Insomnia due to other  mental disorder  F51.05 cloNIDine (CATAPRES) 0.1 MG tablet   F99 zolpidem (AMBIEN) 10 MG tablet    3. Major depressive disorder, recurrent episode, moderate  F33.1 FLUoxetine (PROZAC) 20 MG capsule    4. Panic attack  F41.0 ALPRAZolam (XANAX) 0.5 MG tablet    FLUoxetine (PROZAC) 20 MG capsule      Receiving Psychotherapy: No    RECOMMENDATIONS:  Greater than 50% of 30 min face to face time with patient was spent on counseling and coordination of care.  We reviewed her progress with anxiety and depression. She is is more confident that medications are starting to work. She is requesting no changes with medication this visit. She will be follow up in June due to a  trip to [place] that is scheduled.   We agreed to: To continue Prozac to 60 mg daily in the a.m. To continue Xanax 0.5 mg 3 times daily as needed only for severe anxiety.  We agreed to reassess and possibly reduce to twice daily in 1 month depending on her condition. Continue Seroquel 25 mg at bedtime. May increase to 50 mg if not effective. To continue hydroxyzine 25 mg 3 times daily as needed To continue clonidine 0.1 mg at bedtime, and then another 0.1 mg in the morning when awakening. Reduce Ambien to 5 mg at bedtime Will report side effects or worsening symptoms Provided emergency contact information Reviewed PDMP         Joan Flores, NP

## 2022-08-31 ENCOUNTER — Other Ambulatory Visit: Payer: Self-pay | Admitting: Behavioral Health

## 2022-08-31 DIAGNOSIS — F5105 Insomnia due to other mental disorder: Secondary | ICD-10-CM

## 2022-08-31 NOTE — Telephone Encounter (Signed)
Pt LVM @ 1:06p.  She said the original script she had was for taking a  pill of Seroquel a day.  Arlys John had told her should could take between  to  a day, therefore she will run out of meds before the next script is showing due.  She said she has 6 days left.  Next appt 6/26

## 2022-09-01 ENCOUNTER — Other Ambulatory Visit: Payer: Self-pay | Admitting: Behavioral Health

## 2022-09-01 ENCOUNTER — Telehealth: Payer: Self-pay

## 2022-09-01 DIAGNOSIS — F5105 Insomnia due to other mental disorder: Secondary | ICD-10-CM

## 2022-09-01 DIAGNOSIS — F411 Generalized anxiety disorder: Secondary | ICD-10-CM

## 2022-09-01 MED ORDER — QUETIAPINE FUMARATE 100 MG PO TABS: ORAL_TABLET | ORAL | 1 refills | Status: DC

## 2022-09-01 NOTE — Telephone Encounter (Signed)
Patient notified

## 2022-09-08 ENCOUNTER — Ambulatory Visit: Payer: Medicare Other | Admitting: Behavioral Health

## 2022-09-18 ENCOUNTER — Ambulatory Visit: Payer: Medicare Other | Admitting: Behavioral Health

## 2022-09-19 DIAGNOSIS — L570 Actinic keratosis: Secondary | ICD-10-CM | POA: Diagnosis not present

## 2022-09-19 DIAGNOSIS — L820 Inflamed seborrheic keratosis: Secondary | ICD-10-CM | POA: Diagnosis not present

## 2022-09-20 DIAGNOSIS — F418 Other specified anxiety disorders: Secondary | ICD-10-CM | POA: Diagnosis not present

## 2022-09-20 DIAGNOSIS — G4700 Insomnia, unspecified: Secondary | ICD-10-CM | POA: Diagnosis not present

## 2022-09-20 DIAGNOSIS — E1169 Type 2 diabetes mellitus with other specified complication: Secondary | ICD-10-CM | POA: Diagnosis not present

## 2022-09-20 DIAGNOSIS — I7300 Raynaud's syndrome without gangrene: Secondary | ICD-10-CM | POA: Diagnosis not present

## 2022-09-22 ENCOUNTER — Other Ambulatory Visit: Payer: Self-pay | Admitting: Behavioral Health

## 2022-09-22 DIAGNOSIS — F411 Generalized anxiety disorder: Secondary | ICD-10-CM

## 2022-09-24 ENCOUNTER — Other Ambulatory Visit: Payer: Self-pay | Admitting: Behavioral Health

## 2022-09-24 DIAGNOSIS — F411 Generalized anxiety disorder: Secondary | ICD-10-CM

## 2022-09-24 DIAGNOSIS — F99 Mental disorder, not otherwise specified: Secondary | ICD-10-CM

## 2022-10-27 ENCOUNTER — Other Ambulatory Visit (HOSPITAL_BASED_OUTPATIENT_CLINIC_OR_DEPARTMENT_OTHER): Payer: Self-pay

## 2022-10-30 ENCOUNTER — Other Ambulatory Visit (HOSPITAL_BASED_OUTPATIENT_CLINIC_OR_DEPARTMENT_OTHER): Payer: Self-pay

## 2022-11-01 ENCOUNTER — Ambulatory Visit: Payer: Medicare Other | Admitting: Behavioral Health

## 2022-12-05 ENCOUNTER — Ambulatory Visit: Payer: Medicare Other | Admitting: Behavioral Health

## 2022-12-05 ENCOUNTER — Encounter: Payer: Self-pay | Admitting: Behavioral Health

## 2022-12-05 ENCOUNTER — Other Ambulatory Visit: Payer: Self-pay | Admitting: Behavioral Health

## 2022-12-05 DIAGNOSIS — F411 Generalized anxiety disorder: Secondary | ICD-10-CM

## 2022-12-05 DIAGNOSIS — F410 Panic disorder [episodic paroxysmal anxiety] without agoraphobia: Secondary | ICD-10-CM | POA: Diagnosis not present

## 2022-12-05 DIAGNOSIS — F331 Major depressive disorder, recurrent, moderate: Secondary | ICD-10-CM | POA: Diagnosis not present

## 2022-12-05 DIAGNOSIS — F99 Mental disorder, not otherwise specified: Secondary | ICD-10-CM

## 2022-12-05 DIAGNOSIS — F5105 Insomnia due to other mental disorder: Secondary | ICD-10-CM | POA: Diagnosis not present

## 2022-12-05 MED ORDER — FLUOXETINE HCL 20 MG PO CAPS: 20.0000 mg | ORAL_CAPSULE | Freq: Every day | ORAL | 1 refills | Status: DC

## 2022-12-05 MED ORDER — CLONIDINE HCL 0.1 MG PO TABS: ORAL_TABLET | ORAL | 2 refills | Status: DC

## 2022-12-05 MED ORDER — ZOLPIDEM TARTRATE 10 MG PO TABS: 10.0000 mg | ORAL_TABLET | Freq: Every evening | ORAL | 0 refills | Status: DC | PRN

## 2022-12-05 MED ORDER — QUETIAPINE FUMARATE 100 MG PO TABS: ORAL_TABLET | ORAL | 1 refills | Status: DC

## 2022-12-05 MED ORDER — FLUOXETINE HCL 40 MG PO CAPS: 40.0000 mg | ORAL_CAPSULE | Freq: Every day | ORAL | 1 refills | Status: DC

## 2022-12-05 MED ORDER — HYDROXYZINE HCL 25 MG PO TABS: 25.0000 mg | ORAL_TABLET | Freq: Three times a day (TID) | ORAL | 1 refills | Status: DC | PRN

## 2022-12-05 NOTE — Progress Notes (Signed)
[facility]  Patient ID: Debbie Norris,  MRN: [id number]  PCP: Shirlean Mylar, MD  Date of Evaluation: 12/05/2022 Time spent:30 minutes  Chief Complaint:   HISTORY/CURRENT STATUS: HPI  "Debbie Norris", 72 year old female presents to this for follow up and medication management.  Collateral information should be considered reliable.  No changes since last visit. Says that she has some significant  improvement with anxiety and sleep since last visit. She feels like adding Seroquel has been beneficial.  She is now getting average of 6-7 hours per night. She rates her anxiety today 4/10, and depression at 3/10.  She denies mania, no psychosis, no auditory or visual hallucinations.  She does endorse to alcoholic beverages per evening.  Usually 2 beers.  Says that she consumes at the same time every day usually around dinnertime.  She denies SI or HI.   Prior psychiatric medication trials: Prozac Hydroxyzine Ambien Xanax     Individual Medical History/ Review of Systems: Changes? :No   Allergies: Cream base, Ketorolac tromethamine, Pineapple, Sevoflurane, Atorvastatin, Benicar [olmesartan], Lisinopril, Metformin and related, Other, Phenergan [promethazine hcl], and Valdecoxib  Current Medications:  Current Outpatient Medications:    hydrOXYzine (ATARAX) 25 MG tablet, Take 1 tablet (25 mg total) by mouth 3 (three) times daily as needed., Disp: 90 tablet, Rfl: 1   albuterol (PROVENTIL HFA) 108 (90 Base) MCG/ACT inhaler, TAKE 2 PUFFS BY MOUTH EVERY 4 TO 6 HOURS AS NEEDED, Disp: 6.7 each, Rfl: 11   ALPRAZolam (XANAX) 0.5 MG tablet, Take 1 tablet (0.5 mg total) by mouth 3 (three) times daily as needed for anxiety., Disp: 90 tablet, Rfl: 1   amoxicillin-clavulanate (AUGMENTIN) 875-125 MG tablet, Take 1 tablet by mouth 2 (two) times daily., Disp: 14 tablet, Rfl: 0   azelastine (ASTELIN) 0.1 % nasal spray, Place 2 sprays into both nostrils 2 (two) times daily. Use in each nostril as  directed, Disp: 30 mL, Rfl: 12   Calcium Carbonate Antacid (TUMS PO), Take 1,000 mg by mouth 3 (three) times daily., Disp: , Rfl:    cetirizine (ZYRTEC) 10 MG chewable tablet, Chew 10 mg by mouth daily., Disp: , Rfl:    cloNIDine (CATAPRES) 0.1 MG tablet, Take one tablet  by mouth at bedtime and then one tablet in the morning when wakening., Disp: 60 tablet, Rfl: 2   Coenzyme Q10 (Q-10 CO-ENZYME PO), Take 300 mg by mouth daily., Disp: , Rfl:    cyclobenzaprine (FLEXERIL) 10 MG tablet, Take 10 mg by mouth every 8 (eight) hours as needed., Disp: , Rfl:    diclofenac sodium (VOLTAREN) 1 % GEL, As directed, Disp: , Rfl: 1   Evolocumab (REPATHA SURECLICK) 140 MG/ML SOAJ, INJECT 1 PEN. INTO THE SKIN EVERY 14 (FOURTEEN) DAYS., Disp: 6 mL, Rfl: 1   Ferrous Sulfate (SLOW FE PO), Take by mouth., Disp: , Rfl:    fluconazole (DIFLUCAN) 150 MG tablet, Take 1 tablet (150 mg total) by mouth daily., Disp: 7 tablet, Rfl: 1   FLUoxetine (PROZAC) 20 MG capsule, Take 1 capsule (20 mg total) by mouth daily., Disp: 90 capsule, Rfl: 1   FLUoxetine (PROZAC) 40 MG capsule, Take 1 capsule (40 mg total) by mouth daily., Disp: 90 capsule, Rfl: 1   fluticasone (FLONASE) 50 MCG/ACT nasal spray, Place 2 sprays into both nostrils 2 (two) times daily. , Disp: , Rfl:    hydrOXYzine (ATARAX) 25 MG tablet, TAKE 1 TABLET BY MOUTH THREE TIMES A DAY AS NEEDED, Disp: 270 tablet, Rfl: 0   hydrOXYzine (  ATARAX) 50 MG tablet, TAKE 1/2 TO 1 TABLET BY MOUTH TWICE A DAY AS NEEDED FOR ANXIETY, Disp: 180 tablet, Rfl: 1   OZEMPIC, 0.25 OR 0.5 MG/DOSE, 2 MG/3ML SOPN, SMARTSIG:0.25 Milligram(s) SUB-Q Once a Week, Disp: , Rfl:    pantoprazole (PROTONIX) 40 MG tablet, Take 40 mg by mouth 2 (two) times daily at 10 am and 4 pm. , Disp: , Rfl:    QUEtiapine (SEROQUEL) 100 MG tablet, Take 1/2-1 tablet by mouth at bedtime daily for sleep and anxiety, Disp: 90 tablet, Rfl: 1   Semaglutide,0.25 or 0.5MG /DOS, (OZEMPIC, 0.25 OR 0.5 MG/DOSE,) 2 MG/3ML SOPN,  Inject 0.25 mg into the skin once a week., Disp: 9 mL, Rfl: 2   Semaglutide,0.25 or 0.5MG /DOS, (OZEMPIC, 0.25 OR 0.5 MG/DOSE,) 2 MG/3ML SOPN, Inject 0.5 mg into the skin every 7 (seven) days., Disp: 3 mL, Rfl: 4   Semaglutide,0.25 or 0.5MG /DOS, (OZEMPIC, 0.25 OR 0.5 MG/DOSE,) 2 MG/3ML SOPN, Inject 0.5 mg into the skin once a week., Disp: 9 mL, Rfl: 3   VAGIFEM 10 MCG TABS vaginal tablet, Place 1 tablet vaginally 2 (two) times a week. , Disp: , Rfl: 3   vitamin B-12 (CYANOCOBALAMIN) 1000 MCG tablet, Take 1,000 mcg by mouth daily., Disp: , Rfl:    Vitamin D, Ergocalciferol, (DRISDOL) 50000 UNITS CAPS capsule, Take 50,000 Units by mouth daily. Sundays, Disp: , Rfl:    zolpidem (AMBIEN) 10 MG tablet, Take 0.5-1 tablets (5-10 mg total) by mouth at bedtime as needed., Disp: 90 tablet, Rfl: 0   zolpidem (AMBIEN) 10 MG tablet, Take 1 tablet (10 mg total) by mouth at bedtime as needed for sleep., Disp: 90 tablet, Rfl: 0 Medication Side Effects: none  Family Medical/ Social History: Changes? No  MENTAL HEALTH EXAM:  There were no vitals taken for this visit.There is no height or weight on file to calculate BMI.  General Appearance: Casual and Well Groomed  Eye Contact:  Good  Speech:  Clear and Coherent  Volume:  Normal  Mood:  Anxious  Affect:  Appropriate  Thought Process:  Coherent  Orientation:  Full (Time, Place, and Person)  Thought Content: Logical   Suicidal Thoughts:  No  Homicidal Thoughts:  No  Memory:  WNL  Judgement:  Good  Insight:  Good  Psychomotor Activity:  Normal  Concentration:  Concentration: Good  Recall:  Good  Fund of Knowledge: Good  Language: Good  Assets:  Desire for Improvement  ADL's:  Intact  Cognition: WNL  Prognosis:  Good    DIAGNOSES:    ICD-10-CM   1. Generalized anxiety disorder  F41.1 cloNIDine (CATAPRES) 0.1 MG tablet    FLUoxetine (PROZAC) 20 MG capsule    FLUoxetine (PROZAC) 40 MG capsule    QUEtiapine (SEROQUEL) 100 MG tablet    hydrOXYzine  (ATARAX) 25 MG tablet    2. Insomnia due to other mental disorder  F51.05 cloNIDine (CATAPRES) 0.1 MG tablet   F99 QUEtiapine (SEROQUEL) 100 MG tablet    zolpidem (AMBIEN) 10 MG tablet    3. Major depressive disorder, recurrent episode, moderate (HCC)  F33.1 FLUoxetine (PROZAC) 20 MG capsule    FLUoxetine (PROZAC) 40 MG capsule    4. Panic attack  F41.0 FLUoxetine (PROZAC) 20 MG capsule    FLUoxetine (PROZAC) 40 MG capsule      Receiving Psychotherapy: No    RECOMMENDATIONS:   Greater than 50% of 30 min face to face time with patient was spent on counseling and coordination of care.  We reviewed her progress with anxiety and depression. She is is more confident that medications are starting to work. She is requesting no changes with medication this visit. She will be follow up in June due to a trip to [place] that is scheduled.    We agreed to:  To continue Prozac to 60 mg daily in the a.m. To continue Xanax 0.5 mg 2 times daily as needed only for severe anxiety.   Continue Seroquel 25 mg at bedtime. May increase to 50 mg if not effective. To continue hydroxyzine 25 mg 3 times daily as needed To continue clonidine 0.1 mg at bedtime, and then another 0.1 mg in the morning when awakening. Reduce Ambien to 5 mg if possible Will report side effects or worsening symptoms Provided emergency contact information Reviewed PDMP     Joan Flores, NP

## 2022-12-08 ENCOUNTER — Other Ambulatory Visit: Payer: Self-pay | Admitting: Behavioral Health

## 2022-12-08 DIAGNOSIS — F5105 Insomnia due to other mental disorder: Secondary | ICD-10-CM

## 2022-12-08 DIAGNOSIS — F411 Generalized anxiety disorder: Secondary | ICD-10-CM

## 2022-12-11 DIAGNOSIS — H5712 Ocular pain, left eye: Secondary | ICD-10-CM | POA: Diagnosis not present

## 2022-12-16 ENCOUNTER — Other Ambulatory Visit: Payer: Self-pay | Admitting: Behavioral Health

## 2022-12-16 ENCOUNTER — Other Ambulatory Visit: Payer: Self-pay | Admitting: Cardiology

## 2022-12-16 DIAGNOSIS — F410 Panic disorder [episodic paroxysmal anxiety] without agoraphobia: Secondary | ICD-10-CM

## 2022-12-16 DIAGNOSIS — F411 Generalized anxiety disorder: Secondary | ICD-10-CM

## 2022-12-16 DIAGNOSIS — E7800 Pure hypercholesterolemia, unspecified: Secondary | ICD-10-CM

## 2022-12-16 DIAGNOSIS — E7841 Elevated Lipoprotein(a): Secondary | ICD-10-CM

## 2022-12-18 DIAGNOSIS — H2000 Unspecified acute and subacute iridocyclitis: Secondary | ICD-10-CM | POA: Diagnosis not present

## 2022-12-20 ENCOUNTER — Other Ambulatory Visit (HOSPITAL_BASED_OUTPATIENT_CLINIC_OR_DEPARTMENT_OTHER): Payer: Self-pay

## 2022-12-25 ENCOUNTER — Other Ambulatory Visit (HOSPITAL_BASED_OUTPATIENT_CLINIC_OR_DEPARTMENT_OTHER): Payer: Self-pay

## 2022-12-25 DIAGNOSIS — J019 Acute sinusitis, unspecified: Secondary | ICD-10-CM | POA: Diagnosis not present

## 2022-12-25 DIAGNOSIS — E785 Hyperlipidemia, unspecified: Secondary | ICD-10-CM | POA: Diagnosis not present

## 2022-12-25 DIAGNOSIS — I10 Essential (primary) hypertension: Secondary | ICD-10-CM | POA: Diagnosis not present

## 2022-12-25 DIAGNOSIS — E1169 Type 2 diabetes mellitus with other specified complication: Secondary | ICD-10-CM | POA: Diagnosis not present

## 2023-01-18 ENCOUNTER — Other Ambulatory Visit (HOSPITAL_BASED_OUTPATIENT_CLINIC_OR_DEPARTMENT_OTHER): Payer: Self-pay

## 2023-01-18 ENCOUNTER — Other Ambulatory Visit: Payer: Self-pay

## 2023-02-24 ENCOUNTER — Other Ambulatory Visit: Payer: Self-pay | Admitting: Behavioral Health

## 2023-02-24 DIAGNOSIS — F411 Generalized anxiety disorder: Secondary | ICD-10-CM

## 2023-02-24 DIAGNOSIS — F5105 Insomnia due to other mental disorder: Secondary | ICD-10-CM

## 2023-02-28 ENCOUNTER — Other Ambulatory Visit: Payer: Self-pay | Admitting: Behavioral Health

## 2023-02-28 DIAGNOSIS — F5105 Insomnia due to other mental disorder: Secondary | ICD-10-CM

## 2023-03-06 NOTE — Progress Notes (Signed)
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 Allergy Profile-09/10/2013-negative-total IgE 12.5 with no specific elevations PFT 11/25/13-within normal limits with insignificant response to bronchodilator Triggers for asthma include strong odors, seasonal pollens, sinus infections, and the room at work which she goes into intermittently- experiencing hoarseness and cough. FENO 01/02/17- 12 ( not elevated) Office Spirometry 8/128/18- WNL-FVC 2.79/106%, FEV1 2.13/112%, ratio 0.77, FEF 25-75% 1.95/129% -----------------------------------------------------------------------------------   02/14/22-  72 year old female never smoker followed for Asthma, allergic rhinitis, Recurrent acute Maxillary Sinusitis complicated by GERD, aortic regurgitation, RBBB, DM2, depression, -Albuterol hfa, zyrtec, flonase,Mucinex. Covid vax 3 Phizer Flu vax- senior ACT- 20 Not using maintenance inhaler. Feels asthma control is good. Occasional sinus infection. Asks about Flonase vs azelastine- discussed. Asks augmentin and diflucan to hold for upcoming trip to [place].  03/08/23- 72 year old female never smoker followed for Asthma, allergic rhinitis, Recurrent acute Maxillary Sinusitis complicated by GERD, aortic regurgitation, RBBB, DM2, depression, -Albuterol hfa, zyrtec, flonase,Mucinex. ACT score- 20 -----Breathing has been good, Coughing up some yellowish tint  Thinks she has had a couple episodes of reflux in sleep with some aspiration, most recently 4 days ago. Cough productive white/ yellow 2-3 tsps. HOB is elevated and she is taking protonix twice daily. Used inhaler only once, when exposed to perfume.   ROS-see HPI    + = positive Constitutional:   No-   weight loss, night sweats, fevers, chills, fatigue, lassitude. HEENT:   No-  headaches, +difficulty swallowing,  tooth/dental problems, sore throat,       Sneezing, itching, +ear ache, +nasal congestion, post nasal drip,  CV:  No-   chest pain, orthopnea, PND, swelling in lower extremities, anasarca,                                                                         dizziness, palpitations Resp: no-shortness of breath with exertion or at rest.             + productive cough, non-productive cough,  No- coughing up of blood.              No-   change in color of mucus.  No- wheezing.   Skin: No-   rash or lesions. GI:  No-heartburn, indigestion, abdominal pain, nausea, vomiting, GU:  MS:  +  joint pain or swelling.  No- decreased range of motion.  No- back pain. Neuro-     nothing unusual Psych:  No- change in mood or affect. + depression or anxiety.  No memory loss.  OBJ- Physical Exam General- Alert, Oriented, Affect-appropriate, Distress- none acute Skin- rash-none, lesions- none, excoriation- none Lymphadenopathy- none Head- atraumatic            Eyes- Gross vision intact, PERRLA, conjunctivae and secretions clear            Ears- Hearing, canals-normal            Nose- mucosa normal, no-Septal dev, +, no-polyps, erosion, perforation             Throat- Mallampati II , mucosa clear, drainage- none, tonsils- atrophic Neck- flexible , trachea midline, no stridor ,  thyroid nl, carotid no bruit Chest - symmetrical excursion , unlabored           Heart/CV- RRR ,  Murmur +1/6 syst A.I. , no gallop  , no rub, nl s1 s2                           - JVD- none , edema- none, stasis changes- none, varices- none           Lung- clear to P&A, wheeze- none, cough+loose/ with deep breath , dullness-none, rub- none           Chest wall-  Abd-  Br/ Gen/ Rectal- Not done, not indicated Extrem- cyanosis- none, clubbing, none, atrophy- none, strength- nl Neuro- grossly intact to observation

## 2023-03-07 ENCOUNTER — Ambulatory Visit (INDEPENDENT_AMBULATORY_CARE_PROVIDER_SITE_OTHER): Payer: Medicare Other | Admitting: Behavioral Health

## 2023-03-07 ENCOUNTER — Encounter: Payer: Self-pay | Admitting: Behavioral Health

## 2023-03-07 DIAGNOSIS — F99 Mental disorder, not otherwise specified: Secondary | ICD-10-CM | POA: Diagnosis not present

## 2023-03-07 DIAGNOSIS — F411 Generalized anxiety disorder: Secondary | ICD-10-CM | POA: Diagnosis not present

## 2023-03-07 DIAGNOSIS — F5105 Insomnia due to other mental disorder: Secondary | ICD-10-CM | POA: Diagnosis not present

## 2023-03-07 DIAGNOSIS — F331 Major depressive disorder, recurrent, moderate: Secondary | ICD-10-CM

## 2023-03-07 NOTE — Progress Notes (Signed)
[facility]  Patient ID: EVALIN SAGRERO,  MRN: [id number]  PCP: Shirlean Mylar, MD  Date of Evaluation: 03/07/2023 Time spent:30 minutes  Chief Complaint:  Chief Complaint   Anxiety; Depression; Follow-up; Patient Education; Medication Refill     HISTORY/CURRENT STATUS: HPI "Debbie Norris", 72 year old female presents to this for follow up and medication management.  Collateral information should be considered reliable.  No changes since last visit. Says that she has some significant  improvement with anxiety and sleep since last visit. Sometime she feels shaky early afternoon and wonders if it is related to blood sugars. She has started checking. Still having quote "petty" disagreement and frustration with her husband. She realizes that it is insignificant most of the time. She feels like adding Seroquel has been beneficial.  She is now getting average of 6-7 hours per night. She rates her anxiety today 4/10, and depression at 3/10.  She denies mania, no psychosis, no auditory or visual hallucinations.  She does endorse to alcoholic beverages per evening.  Usually 2 beers.  Says that she consumes at the same time every day usually around dinnertime.  She denies SI or HI.   Prior psychiatric medication trials: Prozac Hydroxyzine Ambien Xanax         Individual Medical History/ Review of Systems: Changes? :No   Allergies: Cream base, Ketorolac tromethamine, Pineapple, Sevoflurane, Atorvastatin, Benicar [olmesartan], Lisinopril, Metformin and related, Other, Phenergan [promethazine hcl], and Valdecoxib  Current Medications:  Current Outpatient Medications:    albuterol (PROVENTIL HFA) 108 (90 Base) MCG/ACT inhaler, TAKE 2 PUFFS BY MOUTH EVERY 4 TO 6 HOURS AS NEEDED, Disp: 6.7 each, Rfl: 11   ALPRAZolam (XANAX) 0.5 MG tablet, TAKE 1 TABLET (0.5 MG TOTAL) BY MOUTH 3 (THREE) TIMES DAILY AS NEEDED FOR ANXIETY., Disp: 90 tablet, Rfl: 1   amoxicillin-clavulanate (AUGMENTIN)  875-125 MG tablet, Take 1 tablet by mouth 2 (two) times daily., Disp: 14 tablet, Rfl: 0   azelastine (ASTELIN) 0.1 % nasal spray, Place 2 sprays into both nostrils 2 (two) times daily. Use in each nostril as directed, Disp: 30 mL, Rfl: 12   Calcium Carbonate Antacid (TUMS PO), Take 1,000 mg by mouth 3 (three) times daily., Disp: , Rfl:    cetirizine (ZYRTEC) 10 MG chewable tablet, Chew 10 mg by mouth daily., Disp: , Rfl:    cloNIDine (CATAPRES) 0.1 MG tablet, TAKE ONE TABLET BY MOUTH AT BEDTIME AND THEN ONE TABLET IN THE MORNING WHEN WAKENING., Disp: 60 tablet, Rfl: 0   Coenzyme Q10 (Q-10 CO-ENZYME PO), Take 300 mg by mouth daily., Disp: , Rfl:    cyclobenzaprine (FLEXERIL) 10 MG tablet, Take 10 mg by mouth every 8 (eight) hours as needed., Disp: , Rfl:    diclofenac sodium (VOLTAREN) 1 % GEL, As directed, Disp: , Rfl: 1   Evolocumab (REPATHA SURECLICK) 140 MG/ML SOAJ, INJECT 1 PEN INTO SKIN EVERY 14 DAYS, Disp: 2 mL, Rfl: 11   Ferrous Sulfate (SLOW FE PO), Take by mouth., Disp: , Rfl:    fluconazole (DIFLUCAN) 150 MG tablet, Take 1 tablet (150 mg total) by mouth daily., Disp: 7 tablet, Rfl: 1   FLUoxetine (PROZAC) 20 MG capsule, Take 1 capsule (20 mg total) by mouth daily., Disp: 90 capsule, Rfl: 1   FLUoxetine (PROZAC) 40 MG capsule, Take 1 capsule (40 mg total) by mouth daily., Disp: 90 capsule, Rfl: 1   fluticasone (FLONASE) 50 MCG/ACT nasal spray, Place 2 sprays into both nostrils 2 (two) times daily. , Disp: , Rfl:  hydrOXYzine (ATARAX) 25 MG tablet, TAKE 1 TABLET BY MOUTH THREE TIMES A DAY AS NEEDED, Disp: 270 tablet, Rfl: 0   hydrOXYzine (ATARAX) 25 MG tablet, Take 1 tablet (25 mg total) by mouth 3 (three) times daily as needed., Disp: 90 tablet, Rfl: 1   hydrOXYzine (ATARAX) 50 MG tablet, TAKE 1/2 TO 1 TABLET BY MOUTH TWICE A DAY AS NEEDED FOR ANXIETY, Disp: 180 tablet, Rfl: 1   OZEMPIC, 0.25 OR 0.5 MG/DOSE, 2 MG/3ML SOPN, SMARTSIG:0.25 Milligram(s) SUB-Q Once a Week, Disp: , Rfl:     pantoprazole (PROTONIX) 40 MG tablet, Take 40 mg by mouth 2 (two) times daily at 10 am and 4 pm. , Disp: , Rfl:    QUEtiapine (SEROQUEL) 100 MG tablet, Take 1/2-1 tablet by mouth at bedtime daily for sleep and anxiety, Disp: 90 tablet, Rfl: 1   Semaglutide,0.25 or 0.5MG /DOS, (OZEMPIC, 0.25 OR 0.5 MG/DOSE,) 2 MG/3ML SOPN, Inject 0.25 mg into the skin once a week., Disp: 9 mL, Rfl: 2   Semaglutide,0.25 or 0.5MG /DOS, (OZEMPIC, 0.25 OR 0.5 MG/DOSE,) 2 MG/3ML SOPN, Inject 0.5 mg into the skin every 7 (seven) days., Disp: 3 mL, Rfl: 4   Semaglutide,0.25 or 0.5MG /DOS, (OZEMPIC, 0.25 OR 0.5 MG/DOSE,) 2 MG/3ML SOPN, Inject 0.5 mg into the skin once a week., Disp: 9 mL, Rfl: 3   VAGIFEM 10 MCG TABS vaginal tablet, Place 1 tablet vaginally 2 (two) times a week. , Disp: , Rfl: 3   vitamin B-12 (CYANOCOBALAMIN) 1000 MCG tablet, Take 1,000 mcg by mouth daily., Disp: , Rfl:    Vitamin D, Ergocalciferol, (DRISDOL) 50000 UNITS CAPS capsule, Take 50,000 Units by mouth daily. Sundays, Disp: , Rfl:    zolpidem (AMBIEN) 10 MG tablet, Take 0.5-1 tablets (5-10 mg total) by mouth at bedtime as needed., Disp: 90 tablet, Rfl: 0   zolpidem (AMBIEN) 10 MG tablet, Take 1 tablet (10 mg total) by mouth at bedtime as needed for sleep., Disp: 90 tablet, Rfl: 0 Medication Side Effects: none  Family Medical/ Social History: Changes? No  MENTAL HEALTH EXAM:  There were no vitals taken for this visit.There is no height or weight on file to calculate BMI.  General Appearance: Casual, Neat, and Well Groomed  Eye Contact:  Good  Speech:  Clear and Coherent  Volume:  Normal  Mood:  Anxious, Depressed, and Dysphoric  Affect:  Appropriate  Thought Process:  Coherent  Orientation:  Full (Time, Place, and Person)  Thought Content: Logical   Suicidal Thoughts:  No  Homicidal Thoughts:  No  Memory:  WNL  Judgement:  Good  Insight:  Good  Psychomotor Activity:  Normal  Concentration:  Concentration: Good  Recall:  Good  Fund of  Knowledge: Good  Language: Good  Assets:  Desire for Improvement  ADL's:  Intact  Cognition: WNL  Prognosis:  Good    DIAGNOSES: No diagnosis found.  Receiving Psychotherapy: No    RECOMMENDATIONS:   Greater than 50% of 30 min face to face time with patient was spent on counseling and coordination of care.  We reviewed her progress with anxiety and depression. Overall happy with her medications. Most of what she is experiencing is frustration with marital communication and dynamics in which I recommended therapy.  She is requesting no changes with medication this visit.    We agreed to:   To continue Prozac to 60 mg daily in the a.m. To continue Xanax 0.5 mg 2 times daily as needed only for severe anxiety.   Continue  Seroquel 25 mg at bedtime. May increase to 50 mg if not effective. To continue hydroxyzine 25 mg 3 times daily as needed To continue clonidine 0.1 mg at bedtime, and then another 0.1 mg in the morning when awakening. Continue  Ambien to 5 mg if possible. She is taking one half at bedtime and another half when she wakes up to urinate later in the night.  Will report side effects or worsening symptoms Provided emergency contact information Reviewed PDMP            Joan Flores, NP

## 2023-03-08 ENCOUNTER — Encounter: Payer: Self-pay | Admitting: Internal Medicine

## 2023-03-08 ENCOUNTER — Ambulatory Visit: Payer: Medicare Other

## 2023-03-08 ENCOUNTER — Ambulatory Visit: Payer: Medicare Other | Admitting: Internal Medicine

## 2023-03-08 VITALS — BP 123/71 | HR 73 | Ht 62.0 in | Wt 147.0 lb

## 2023-03-08 DIAGNOSIS — J4489 Other specified chronic obstructive pulmonary disease: Secondary | ICD-10-CM | POA: Diagnosis not present

## 2023-03-08 DIAGNOSIS — K219 Gastro-esophageal reflux disease without esophagitis: Secondary | ICD-10-CM | POA: Diagnosis not present

## 2023-03-08 DIAGNOSIS — I700 Atherosclerosis of aorta: Secondary | ICD-10-CM | POA: Diagnosis not present

## 2023-03-08 DIAGNOSIS — J45909 Unspecified asthma, uncomplicated: Secondary | ICD-10-CM | POA: Diagnosis not present

## 2023-03-08 DIAGNOSIS — Z23 Encounter for immunization: Secondary | ICD-10-CM

## 2023-03-08 MED ORDER — ALBUTEROL SULFATE HFA 108 (90 BASE) MCG/ACT IN AERS: INHALATION_SPRAY | RESPIRATORY_TRACT | 11 refills | Status: AC

## 2023-03-08 NOTE — Patient Instructions (Addendum)
Order Flu vax senior  Order- CXR asthmatic bronchitis  mild persistent uncomplicated  Albuterol hfa renewed  Please call if we can help

## 2023-03-09 ENCOUNTER — Other Ambulatory Visit: Payer: Self-pay | Admitting: Behavioral Health

## 2023-03-09 DIAGNOSIS — F410 Panic disorder [episodic paroxysmal anxiety] without agoraphobia: Secondary | ICD-10-CM

## 2023-03-09 DIAGNOSIS — F411 Generalized anxiety disorder: Secondary | ICD-10-CM

## 2023-03-09 DIAGNOSIS — F5105 Insomnia due to other mental disorder: Secondary | ICD-10-CM

## 2023-03-10 ENCOUNTER — Other Ambulatory Visit: Payer: Self-pay | Admitting: Behavioral Health

## 2023-03-14 ENCOUNTER — Other Ambulatory Visit: Payer: Self-pay

## 2023-03-14 MED ORDER — QUETIAPINE FUMARATE 25 MG PO TABS: 25.0000 mg | ORAL_TABLET | Freq: Every day | ORAL | 1 refills | Status: DC

## 2023-03-27 ENCOUNTER — Other Ambulatory Visit: Payer: Self-pay | Admitting: Behavioral Health

## 2023-03-27 DIAGNOSIS — F411 Generalized anxiety disorder: Secondary | ICD-10-CM

## 2023-03-27 DIAGNOSIS — F5105 Insomnia due to other mental disorder: Secondary | ICD-10-CM

## 2023-03-29 ENCOUNTER — Encounter (HOSPITAL_BASED_OUTPATIENT_CLINIC_OR_DEPARTMENT_OTHER): Payer: Self-pay | Admitting: Family Medicine

## 2023-04-05 ENCOUNTER — Encounter: Payer: Self-pay | Admitting: Internal Medicine

## 2023-04-05 NOTE — Assessment & Plan Note (Signed)
Intermittent, acute but current episode resolving.  At least one trigger is aspiration. Plan- flu vax, CXR

## 2023-04-05 NOTE — Assessment & Plan Note (Signed)
Recurrent episodes with at least scant aspiration. Plan- needs to review current status with GI.

## 2023-04-06 ENCOUNTER — Other Ambulatory Visit: Payer: Self-pay | Admitting: Behavioral Health

## 2023-04-08 NOTE — Telephone Encounter (Signed)
Last note says can increase to 50 mg if 25 mg not effective. Verify dose.

## 2023-04-17 DIAGNOSIS — R109 Unspecified abdominal pain: Secondary | ICD-10-CM | POA: Diagnosis not present

## 2023-04-17 DIAGNOSIS — E559 Vitamin D deficiency, unspecified: Secondary | ICD-10-CM | POA: Diagnosis not present

## 2023-04-17 DIAGNOSIS — I7300 Raynaud's syndrome without gangrene: Secondary | ICD-10-CM | POA: Diagnosis not present

## 2023-04-17 DIAGNOSIS — G4700 Insomnia, unspecified: Secondary | ICD-10-CM | POA: Diagnosis not present

## 2023-04-17 DIAGNOSIS — E785 Hyperlipidemia, unspecified: Secondary | ICD-10-CM | POA: Diagnosis not present

## 2023-04-17 DIAGNOSIS — F411 Generalized anxiety disorder: Secondary | ICD-10-CM | POA: Diagnosis not present

## 2023-04-17 DIAGNOSIS — E1165 Type 2 diabetes mellitus with hyperglycemia: Secondary | ICD-10-CM | POA: Diagnosis not present

## 2023-04-17 DIAGNOSIS — I10 Essential (primary) hypertension: Secondary | ICD-10-CM | POA: Diagnosis not present

## 2023-04-17 DIAGNOSIS — Z0000 Encounter for general adult medical examination without abnormal findings: Secondary | ICD-10-CM | POA: Diagnosis not present

## 2023-04-19 ENCOUNTER — Telehealth: Payer: Self-pay | Admitting: Cardiology

## 2023-04-19 NOTE — Telephone Encounter (Signed)
Patient is calling to speak to someone regarding her lipds. Please advise

## 2023-04-20 NOTE — Telephone Encounter (Signed)
Spoke with patient. She is concerned bc her last LDL-C was 88. Previously in the 60's. She started Ozempic and wanted to know if that could have affected it. Advised that ozempic would have had a positive effect not negative. She did start seroquel in Feb which could have increased her LDL-C. She also admits that her diet hasn't been great. Recommended working on diet for 3 months and rechecking. Can discussed with prescribing MD about seroquel. She will have labs at PCP.

## 2023-04-23 ENCOUNTER — Other Ambulatory Visit (HOSPITAL_BASED_OUTPATIENT_CLINIC_OR_DEPARTMENT_OTHER): Payer: Self-pay

## 2023-04-23 ENCOUNTER — Other Ambulatory Visit (HOSPITAL_COMMUNITY): Payer: Self-pay | Admitting: Family Medicine

## 2023-04-23 DIAGNOSIS — R109 Unspecified abdominal pain: Secondary | ICD-10-CM

## 2023-04-23 MED ORDER — AMLODIPINE BESYLATE 2.5 MG PO TABS
2.5000 mg | ORAL_TABLET | Freq: Every day | ORAL | 1 refills | Status: DC
  Filled 2023-04-23 – 2023-05-08 (×2): qty 90, 90d supply, fill #0
  Filled 2023-07-23 – 2023-08-21 (×2): qty 90, 90d supply, fill #1

## 2023-04-24 ENCOUNTER — Ambulatory Visit (HOSPITAL_COMMUNITY)
Admission: RE | Admit: 2023-04-24 | Discharge: 2023-04-24 | Disposition: A | Discharge status: Home / Self Care | Payer: Medicare Other | Source: Ambulatory Visit | Attending: Family Medicine | Admitting: Family Medicine

## 2023-04-24 DIAGNOSIS — R109 Unspecified abdominal pain: Secondary | ICD-10-CM | POA: Insufficient documentation

## 2023-04-24 DIAGNOSIS — Z90710 Acquired absence of both cervix and uterus: Secondary | ICD-10-CM | POA: Diagnosis not present

## 2023-04-24 DIAGNOSIS — Z9049 Acquired absence of other specified parts of digestive tract: Secondary | ICD-10-CM | POA: Diagnosis not present

## 2023-04-24 DIAGNOSIS — N1330 Unspecified hydronephrosis: Secondary | ICD-10-CM | POA: Diagnosis not present

## 2023-04-24 MED ORDER — IOHEXOL 300 MG/ML  SOLN
30.0000 mL | Freq: Once | INTRAMUSCULAR | Status: AC | PRN
  Administered 2023-04-24: 30 mL via ORAL

## 2023-04-24 MED ORDER — IOHEXOL 300 MG/ML  SOLN
100.0000 mL | Freq: Once | INTRAMUSCULAR | Status: AC | PRN
  Administered 2023-04-24: 100 mL via INTRAVENOUS

## 2023-05-03 ENCOUNTER — Other Ambulatory Visit (HOSPITAL_BASED_OUTPATIENT_CLINIC_OR_DEPARTMENT_OTHER): Payer: Self-pay

## 2023-05-08 ENCOUNTER — Other Ambulatory Visit (HOSPITAL_BASED_OUTPATIENT_CLINIC_OR_DEPARTMENT_OTHER): Payer: Self-pay

## 2023-05-11 ENCOUNTER — Encounter (HOSPITAL_COMMUNITY): Payer: Self-pay | Admitting: Emergency Medicine

## 2023-05-11 ENCOUNTER — Emergency Department (HOSPITAL_COMMUNITY)
Admission: EM | Admit: 2023-05-11 | Discharge: 2023-05-11 | Disposition: A | Discharge status: Home / Self Care | Payer: Medicare Other | Attending: Emergency Medicine | Admitting: Emergency Medicine

## 2023-05-11 DIAGNOSIS — T445X1A Poisoning by predominantly beta-adrenoreceptor agonists, accidental (unintentional), initial encounter: Secondary | ICD-10-CM | POA: Insufficient documentation

## 2023-05-11 DIAGNOSIS — R9431 Abnormal electrocardiogram [ECG] [EKG]: Secondary | ICD-10-CM | POA: Diagnosis not present

## 2023-05-11 DIAGNOSIS — J45909 Unspecified asthma, uncomplicated: Secondary | ICD-10-CM | POA: Insufficient documentation

## 2023-05-11 DIAGNOSIS — T43211A Poisoning by selective serotonin and norepinephrine reuptake inhibitors, accidental (unintentional), initial encounter: Secondary | ICD-10-CM | POA: Diagnosis not present

## 2023-05-11 MED ORDER — ONDANSETRON 4 MG PO TBDP
4.0000 mg | ORAL_TABLET | Freq: Once | ORAL | Status: AC
  Administered 2023-05-11: 4 mg via ORAL
  Filled 2023-05-11: qty 1

## 2023-05-11 NOTE — ED Provider Notes (Signed)
 [place] EMERGENCY DEPARTMENT AT [facility] Provider Note   CSN: [id number] Arrival date & time: 05/11/23  1214     History  Chief complaint accidental injection of epinephrine  Debbie Norris is a 73 y.o. female.  HPI   Patient has a history of asthma allergies acid reflux, PVCs, aortic valve insufficiency, right bundle branch block, left anterior fascicular block and hypercholesterolemia.  Patient presents to the ED for evaluation after accidentally injecting herself with an EpiPen.  Patient states she was cleaning up and grabbed the EpiPen when it accidentally injected her right thumb.  Patient states she is having pain at the site of the injection.  She is also feeling somewhat jittery.  She read about the possible side effects and came to the ED for evaluation.  She is not having any chest pain or shortness of breath.  Home Medications Prior to Admission medications   Medication Sig Start Date End Date Taking? Authorizing Provider  albuterol  (PROVENTIL  HFA) 108 (90 Base) MCG/ACT inhaler TAKE 2 PUFFS BY MOUTH EVERY 4 TO 6 HOURS AS NEEDED 03/08/23   Young, Clinton D, MD  ALPRAZolam  (XANAX ) 0.5 MG tablet TAKE 1 TABLET (0.5 MG TOTAL) BY MOUTH 3 (THREE) TIMES DAILY AS NEEDED FOR ANXIETY. 03/12/23   Teresa Redell LABOR, NP  amLODipine  (NORVASC ) 2.5 MG tablet Take 1 tablet (2.5 mg total) by mouth daily. 04/22/23     amoxicillin -clavulanate (AUGMENTIN ) 875-125 MG tablet Take 1 tablet by mouth 2 (two) times daily. 02/14/22   Neysa Rama D, MD  azelastine  (ASTELIN ) 0.1 % nasal spray Place 2 sprays into both nostrils 2 (two) times daily. Use in each nostril as directed Patient not taking: Reported on 03/08/2023 02/14/22   Neysa Rama BIRCH, MD  Calcium  Carbonate Antacid (TUMS PO) Take 1,000 mg by mouth 3 (three) times daily.    [provider]  cetirizine (ZYRTEC) 10 MG chewable tablet Chew 10 mg by mouth daily.    [provider]  cloNIDine  (CATAPRES ) 0.1 MG  tablet TAKE ONE TABLET BY MOUTH AT BEDTIME AND THEN ONE TABLET IN THE MORNING WHEN WAKENING. 03/28/23   Teresa Redell A, NP  Coenzyme Q10 (Q-10 CO-ENZYME PO) Take 300 mg by mouth daily.    [provider]  cyclobenzaprine (FLEXERIL) 10 MG tablet Take 10 mg by mouth every 8 (eight) hours as needed. 03/02/19   [provider]  diclofenac sodium (VOLTAREN) 1 % GEL As directed 09/13/15   [provider]  Evolocumab  (REPATHA  SURECLICK) 140 MG/ML SOAJ INJECT 1 PEN INTO SKIN EVERY 14 DAYS 12/18/22   Jeffrie Oneil BROCKS, MD  Ferrous Sulfate (SLOW FE PO) Take by mouth.    [provider]  fluconazole  (DIFLUCAN ) 150 MG tablet Take 1 tablet (150 mg total) by mouth daily. 02/14/22   Neysa Rama D, MD  FLUoxetine  (PROZAC ) 20 MG capsule Take 1 capsule (20 mg total) by mouth daily. 12/05/22   Teresa Redell LABOR, NP  FLUoxetine  (PROZAC ) 40 MG capsule Take 1 capsule (40 mg total) by mouth daily. 12/05/22   Teresa Redell LABOR, NP  fluticasone  (FLONASE) 50 MCG/ACT nasal spray Place 2 sprays into both nostrils 2 (two) times daily.     [provider]  hydrOXYzine  (ATARAX ) 25 MG tablet TAKE 1 TABLET BY MOUTH THREE TIMES A DAY AS NEEDED 09/24/22   Teresa Redell LABOR, NP  hydrOXYzine  (ATARAX ) 25 MG tablet Take 1 tablet (25 mg total) by mouth 3 (three) times daily as needed. 12/05/22  Teresa Rogue A, NP  hydrOXYzine  (ATARAX ) 50 MG tablet TAKE 1/2 TO 1 TABLET BY MOUTH TWICE A DAY AS NEEDED FOR ANXIETY 09/01/22   Teresa Rogue A, NP  OZEMPIC , 0.25 OR 0.5 MG/DOSE, 2 MG/3ML SOPN SMARTSIG:0.25 Milligram(s) SUB-Q Once a Week 02/06/22   [provider]  pantoprazole (PROTONIX) 40 MG tablet Take 40 mg by mouth 2 (two) times daily at 10 am and 4 pm.     [provider]  QUEtiapine  (SEROQUEL ) 100 MG tablet Take 1/2-1 tablet by mouth at bedtime daily for sleep and anxiety 12/05/22   Teresa Rogue A, NP  QUEtiapine  (SEROQUEL ) 25 MG tablet Take 1 tablet (25 mg total) by mouth at bedtime. 03/14/23    Teresa Rogue LABOR, NP  Semaglutide ,0.25 or 0.5MG /DOS, (OZEMPIC , 0.25 OR 0.5 MG/DOSE,) 2 MG/3ML SOPN Inject 0.25 mg into the skin once a week. 02/06/22   Kip Righter, MD  Semaglutide ,0.25 or 0.5MG /DOS, (OZEMPIC , 0.25 OR 0.5 MG/DOSE,) 2 MG/3ML SOPN Inject 0.5 mg into the skin every 7 (seven) days. 06/22/22     Semaglutide ,0.25 or 0.5MG /DOS, (OZEMPIC , 0.25 OR 0.5 MG/DOSE,) 2 MG/3ML SOPN Inject 0.5 mg into the skin once a week. 06/30/22     VAGIFEM 10 MCG TABS vaginal tablet Place 1 tablet vaginally 2 (two) times a week.  08/12/14   [provider]  vitamin B-12 (CYANOCOBALAMIN ) 1000 MCG tablet Take 1,000 mcg by mouth daily.    [provider]  Vitamin D, Ergocalciferol, (DRISDOL) 50000 UNITS CAPS capsule Take 50,000 Units by mouth daily. Sundays 08/13/13   [provider]  zolpidem  (AMBIEN ) 10 MG tablet Take 0.5-1 tablets (5-10 mg total) by mouth at bedtime as needed. 06/28/22     zolpidem  (AMBIEN ) 10 MG tablet TAKE 1 TABLET BY MOUTH AT BEDTIME AS NEEDED FOR SLEEP. 03/12/23   Teresa Rogue LABOR, NP      Allergies    Cream base, Ketorolac tromethamine, Pineapple, Sevoflurane, Atorvastatin, Benicar [olmesartan], Lisinopril, Metformin and related, Other, Phenergan [promethazine hcl], and Valdecoxib    Review of Systems   Review of Systems  Physical Exam Updated Vital Signs BP 109/73 (BP Location: Left Arm)   Pulse 66   Temp 98.4 F (36.9 C) (Oral)   Resp 12   Ht 1.575 m (5' 2)   Wt 66 kg   SpO2 98%   BMI 26.61 kg/m  Physical Exam Vitals and nursing note reviewed.  Constitutional:      General: She is not in acute distress.    Appearance: She is well-developed.  HENT:     Head: Normocephalic and atraumatic.     Right Ear: External ear normal.     Left Ear: External ear normal.  Eyes:     General: No scleral icterus.       Right eye: No discharge.        Left eye: No discharge.     Conjunctiva/sclera: Conjunctivae normal.  Neck:     Trachea: No tracheal deviation.   Cardiovascular:     Rate and Rhythm: Normal rate and regular rhythm.  Pulmonary:     Effort: Pulmonary effort is normal. No respiratory distress.     Breath sounds: Normal breath sounds. No stridor.  Abdominal:     General: There is no distension.  Musculoskeletal:        General: Tenderness present. No swelling or deformity.     Cervical back: Neck supple.     Comments: Injection site noted right thumb, skin is pale in comparison  to her other digits, no cyanosis noted  Skin:    General: Skin is warm and dry.     Findings: No rash.  Neurological:     Mental Status: She is alert. Mental status is at baseline.     Cranial Nerves: No dysarthria or facial asymmetry.     Motor: No seizure activity.     ED Results / Procedures / Treatments   Labs (all labs ordered are listed, but only abnormal results are displayed) Labs Reviewed - No data to display  EKG EKG Interpretation Date/Time:  Friday May 11 2023 12:24:34 EST Ventricular Rate:  77 PR Interval:  134 QRS Duration:  127 QT Interval:  408 QTC Calculation: 462 R Axis:   263  Text Interpretation: Sinus rhythm RBBB and LAFB Confirmed by Randol Simmonds [phone]) on 05/11/2023 12:44:52 PM  Radiology No results found.  Procedures Procedures    Medications Ordered in ED Medications  ondansetron  (ZOFRAN -ODT) disintegrating tablet 4 mg (4 mg Oral Given 05/11/23 1427)    ED Course/ Medical Decision Making/ A&P Clinical Course as of 05/11/23 1519  Fri May 11, 2023  1316 Discussed with poison control.  Warm off and on for the next hour.   Phentolamine not usually  neccesary [JK]  1518 Patient monitored in the ED.  No dysrhythmias noted.  No evidence of cyanosis had her thumb.  There is some bruising at the site of the injection.  There is still is some distal pallor but it is improving.  Patient feels better and ready for discharge [JK]    Clinical Course User Index [JK] Randol Simmonds, MD                                  Medical Decision Making Risk Prescription drug management.   Patient presents with accidental injection of epinephrine to her thumb.  Reviewed the case with the poison center.  They do not recommend phentolamine at this time.  Will have the patient soak her hand in warm water and in warm blankets.  No evidence of cardiac dysrhythmia at this time.  Will continue to monitor for the next hour or 2.  Patient's thumb continues to show slow improvement.  She does still have some pallor at the tip but that is decreasing.  There is some bruising at the site of the injection.  Thumb is still warm and well-perfused.  Patient feels ready for discharge        Final Clinical Impression(s) / ED Diagnoses Final diagnoses:  Epinephrine poisoning, accidental or unintentional, initial encounter    Rx / DC Orders ED Discharge Orders     None         Randol Simmonds, MD 05/11/23 [phone]

## 2023-05-11 NOTE — Discharge Instructions (Signed)
 Continue to apply a heating pad or warm blankets as needed for the next several hours.  You will continue to have some bruising and soreness as a result of the injection.

## 2023-05-11 NOTE — ED Triage Notes (Signed)
 Pt reports accidentally injecting epi in thumb when cleaning today around 1150. Endorses pain to right thumb and feeling jittery.

## 2023-05-12 DIAGNOSIS — T445X1A Poisoning by predominantly beta-adrenoreceptor agonists, accidental (unintentional), initial encounter: Secondary | ICD-10-CM | POA: Diagnosis not present

## 2023-05-28 ENCOUNTER — Other Ambulatory Visit: Payer: Self-pay | Admitting: Behavioral Health

## 2023-05-28 DIAGNOSIS — F331 Major depressive disorder, recurrent, moderate: Secondary | ICD-10-CM

## 2023-05-28 DIAGNOSIS — F411 Generalized anxiety disorder: Secondary | ICD-10-CM

## 2023-05-28 DIAGNOSIS — F410 Panic disorder [episodic paroxysmal anxiety] without agoraphobia: Secondary | ICD-10-CM

## 2023-05-29 DIAGNOSIS — K219 Gastro-esophageal reflux disease without esophagitis: Secondary | ICD-10-CM | POA: Diagnosis not present

## 2023-05-29 DIAGNOSIS — R1013 Epigastric pain: Secondary | ICD-10-CM | POA: Diagnosis not present

## 2023-05-29 DIAGNOSIS — Z860101 Personal history of adenomatous and serrated colon polyps: Secondary | ICD-10-CM | POA: Diagnosis not present

## 2023-05-29 DIAGNOSIS — I351 Nonrheumatic aortic (valve) insufficiency: Secondary | ICD-10-CM | POA: Diagnosis not present

## 2023-05-31 ENCOUNTER — Ambulatory Visit: Payer: Medicare Other | Admitting: Obstetrics and Gynecology

## 2023-06-07 ENCOUNTER — Ambulatory Visit: Payer: Medicare Other | Admitting: Behavioral Health

## 2023-06-07 ENCOUNTER — Encounter: Payer: Self-pay | Admitting: Behavioral Health

## 2023-06-07 DIAGNOSIS — F411 Generalized anxiety disorder: Secondary | ICD-10-CM

## 2023-06-07 DIAGNOSIS — F331 Major depressive disorder, recurrent, moderate: Secondary | ICD-10-CM

## 2023-06-07 DIAGNOSIS — F99 Mental disorder, not otherwise specified: Secondary | ICD-10-CM

## 2023-06-07 DIAGNOSIS — F5105 Insomnia due to other mental disorder: Secondary | ICD-10-CM | POA: Diagnosis not present

## 2023-06-07 DIAGNOSIS — F410 Panic disorder [episodic paroxysmal anxiety] without agoraphobia: Secondary | ICD-10-CM | POA: Diagnosis not present

## 2023-06-07 MED ORDER — FLUOXETINE HCL 40 MG PO CAPS: 40.0000 mg | ORAL_CAPSULE | Freq: Every day | ORAL | 1 refills | Status: DC

## 2023-06-07 MED ORDER — QUETIAPINE FUMARATE 100 MG PO TABS: ORAL_TABLET | ORAL | 1 refills | Status: AC

## 2023-06-07 MED ORDER — CLONIDINE HCL 0.1 MG PO TABS: ORAL_TABLET | ORAL | 2 refills | Status: DC

## 2023-06-07 MED ORDER — FLUOXETINE HCL 20 MG PO CAPS: 20.0000 mg | ORAL_CAPSULE | Freq: Every day | ORAL | 1 refills | Status: DC

## 2023-06-07 MED ORDER — ALPRAZOLAM 0.5 MG PO TABS: 0.5000 mg | ORAL_TABLET | Freq: Three times a day (TID) | ORAL | 2 refills | Status: DC | PRN

## 2023-06-07 MED ORDER — ZOLPIDEM TARTRATE 10 MG PO TABS: 10.0000 mg | ORAL_TABLET | Freq: Every evening | ORAL | 0 refills | Status: DC | PRN

## 2023-06-07 NOTE — Progress Notes (Signed)
[facility]  Patient ID: Debbie Norris,  MRN: [id number]  PCP: Farris Has, MD  Date of Evaluation: 06/07/2023 Time spent:30 minutes  Chief Complaint:  Chief Complaint   Anxiety; Depression; Follow-up; Medication Refill; Patient Education; Family Problem     HISTORY/CURRENT STATUS: HPI  "Debbie Norris", 73 year old female presents to this for follow up and medication management.  Collateral information should be considered reliable.  No changes since last visit. Says that she has some significant  improvement with anxiety and sleep since last visit. Sometime she feels shaky early afternoon and wonders if it is related to blood sugars. She has started checking. Still having quote "petty" disagreement and frustration with her husband. She realizes that it is insignificant most of the time. She feels like adding Seroquel has been beneficial.  She is now getting average of 6-7 hours per night. She rates her anxiety today 3/10, and depression at 3/10.  She denies mania, no psychosis, no auditory or visual hallucinations.  No ETOH since December.  Says that she consumes at the same time every day usually around dinnertime.  She denies SI or HI.   Prior psychiatric medication trials: Prozac Hydroxyzine Ambien Xanax       Individual Medical History/ Review of Systems: Changes? :No   Allergies: Cream base, Ketorolac tromethamine, Pineapple, Sevoflurane, Atorvastatin, Benicar [olmesartan], Lisinopril, Metformin and related, Other, Phenergan [promethazine hcl], and Valdecoxib  Current Medications:  Current Outpatient Medications:    albuterol (PROVENTIL HFA) 108 (90 Base) MCG/ACT inhaler, TAKE 2 PUFFS BY MOUTH EVERY 4 TO 6 HOURS AS NEEDED, Disp: 6.7 each, Rfl: 11   ALPRAZolam (XANAX) 0.5 MG tablet, Take 1 tablet (0.5 mg total) by mouth 3 (three) times daily as needed., Disp: 90 tablet, Rfl: 2   amLODipine (NORVASC) 2.5 MG tablet, Take 1 tablet (2.5 mg total) by mouth daily.,  Disp: 90 tablet, Rfl: 1   amoxicillin-clavulanate (AUGMENTIN) 875-125 MG tablet, Take 1 tablet by mouth 2 (two) times daily., Disp: 14 tablet, Rfl: 0   azelastine (ASTELIN) 0.1 % nasal spray, Place 2 sprays into both nostrils 2 (two) times daily. Use in each nostril as directed (Patient not taking: Reported on 03/08/2023), Disp: 30 mL, Rfl: 12   Calcium Carbonate Antacid (TUMS PO), Take 1,000 mg by mouth 3 (three) times daily., Disp: , Rfl:    cetirizine (ZYRTEC) 10 MG chewable tablet, Chew 10 mg by mouth daily., Disp: , Rfl:    cloNIDine (CATAPRES) 0.1 MG tablet, Take one tablet  by mouth at bedtime and then one tablet in the morning when wakening., Disp: 60 tablet, Rfl: 2   Coenzyme Q10 (Q-10 CO-ENZYME PO), Take 300 mg by mouth daily., Disp: , Rfl:    cyclobenzaprine (FLEXERIL) 10 MG tablet, Take 10 mg by mouth every 8 (eight) hours as needed., Disp: , Rfl:    diclofenac sodium (VOLTAREN) 1 % GEL, As directed, Disp: , Rfl: 1   Evolocumab (REPATHA SURECLICK) 140 MG/ML SOAJ, INJECT 1 PEN INTO SKIN EVERY 14 DAYS, Disp: 2 mL, Rfl: 11   Ferrous Sulfate (SLOW FE PO), Take by mouth., Disp: , Rfl:    fluconazole (DIFLUCAN) 150 MG tablet, Take 1 tablet (150 mg total) by mouth daily., Disp: 7 tablet, Rfl: 1   FLUoxetine (PROZAC) 20 MG capsule, Take 1 capsule (20 mg total) by mouth daily., Disp: 90 capsule, Rfl: 1   FLUoxetine (PROZAC) 40 MG capsule, Take 1 capsule (40 mg total) by mouth daily., Disp: 90 capsule, Rfl: 1   fluticasone (  FLONASE) 50 MCG/ACT nasal spray, Place 2 sprays into both nostrils 2 (two) times daily. , Disp: , Rfl:    hydrOXYzine (ATARAX) 25 MG tablet, TAKE 1 TABLET BY MOUTH THREE TIMES A DAY AS NEEDED, Disp: 270 tablet, Rfl: 0   hydrOXYzine (ATARAX) 25 MG tablet, Take 1 tablet (25 mg total) by mouth 3 (three) times daily as needed., Disp: 90 tablet, Rfl: 1   hydrOXYzine (ATARAX) 50 MG tablet, TAKE 1/2 TO 1 TABLET BY MOUTH TWICE A DAY AS NEEDED FOR ANXIETY, Disp: 180 tablet, Rfl: 1    OZEMPIC, 0.25 OR 0.5 MG/DOSE, 2 MG/3ML SOPN, SMARTSIG:0.25 Milligram(s) SUB-Q Once a Week, Disp: , Rfl:    pantoprazole (PROTONIX) 40 MG tablet, Take 40 mg by mouth 2 (two) times daily at 10 am and 4 pm. , Disp: , Rfl:    QUEtiapine (SEROQUEL) 100 MG tablet, Take 1/2-1 tablet by mouth at bedtime daily for sleep and anxiety, Disp: 90 tablet, Rfl: 1   QUEtiapine (SEROQUEL) 25 MG tablet, Take 1 tablet (25 mg total) by mouth at bedtime., Disp: 30 tablet, Rfl: 1   Semaglutide,0.25 or 0.5MG /DOS, (OZEMPIC, 0.25 OR 0.5 MG/DOSE,) 2 MG/3ML SOPN, Inject 0.25 mg into the skin once a week., Disp: 9 mL, Rfl: 2   Semaglutide,0.25 or 0.5MG /DOS, (OZEMPIC, 0.25 OR 0.5 MG/DOSE,) 2 MG/3ML SOPN, Inject 0.5 mg into the skin every 7 (seven) days., Disp: 3 mL, Rfl: 4   Semaglutide,0.25 or 0.5MG /DOS, (OZEMPIC, 0.25 OR 0.5 MG/DOSE,) 2 MG/3ML SOPN, Inject 0.5 mg into the skin once a week., Disp: 9 mL, Rfl: 3   VAGIFEM 10 MCG TABS vaginal tablet, Place 1 tablet vaginally 2 (two) times a week. , Disp: , Rfl: 3   vitamin B-12 (CYANOCOBALAMIN) 1000 MCG tablet, Take 1,000 mcg by mouth daily., Disp: , Rfl:    Vitamin D, Ergocalciferol, (DRISDOL) 50000 UNITS CAPS capsule, Take 50,000 Units by mouth daily. Sundays, Disp: , Rfl:    zolpidem (AMBIEN) 10 MG tablet, Take 0.5-1 tablets (5-10 mg total) by mouth at bedtime as needed., Disp: 90 tablet, Rfl: 0   zolpidem (AMBIEN) 10 MG tablet, Take 1 tablet (10 mg total) by mouth at bedtime as needed. for sleep, Disp: 90 tablet, Rfl: 0 Medication Side Effects: none  Family Medical/ Social History: Changes? No  MENTAL HEALTH EXAM:  There were no vitals taken for this visit.There is no height or weight on file to calculate BMI.  General Appearance: Casual, Neat, and Well Groomed  Eye Contact:  Good  Speech:  Clear and Coherent  Volume:  Normal  Mood:  NA  Affect:  Appropriate  Thought Process:  Coherent  Orientation:  Full (Time, Place, and Person)  Thought Content: Logical    Suicidal Thoughts:  No  Homicidal Thoughts:  No  Memory:  WNL  Judgement:  Good  Insight:  NA  Psychomotor Activity:  Normal  Concentration:  Concentration: Good  Recall:  Good  Fund of Knowledge: Good  Language: Good  Assets:  Desire for Improvement  ADL's:  Intact  Cognition: WNL  Prognosis:  Good    DIAGNOSES:    ICD-10-CM   1. Generalized anxiety disorder  F41.1 ALPRAZolam (XANAX) 0.5 MG tablet    FLUoxetine (PROZAC) 40 MG capsule    FLUoxetine (PROZAC) 20 MG capsule    cloNIDine (CATAPRES) 0.1 MG tablet    QUEtiapine (SEROQUEL) 100 MG tablet    2. Panic attack  F41.0 ALPRAZolam (XANAX) 0.5 MG tablet    FLUoxetine (PROZAC) 40 MG  capsule    FLUoxetine (PROZAC) 20 MG capsule    3. Major depressive disorder, recurrent episode, moderate (HCC)  F33.1 FLUoxetine (PROZAC) 40 MG capsule    FLUoxetine (PROZAC) 20 MG capsule    4. Insomnia due to other mental disorder  F51.05 cloNIDine (CATAPRES) 0.1 MG tablet   F99 QUEtiapine (SEROQUEL) 100 MG tablet    zolpidem (AMBIEN) 10 MG tablet      Receiving Psychotherapy: No    RECOMMENDATIONS:   Greater than 50% of 30 min face to face time with patient was spent on counseling and coordination of care.  We reviewed her progress with anxiety and depression. Overall happy with her medications. Most of what she is experiencing is frustration with marital communication and dynamics in which I recommended therapy.  She is requesting no changes with medication this visit.    We agreed to:   To continue Prozac to 60 mg daily in the a.m. To continue Xanax 0.5 mg 2 times daily as needed only for severe anxiety.   Continue Seroquel 25 mg at bedtime. May increase to 50 mg if not effective. To continue hydroxyzine 25 mg 3 times daily as needed To continue clonidine 0.1 mg at bedtime, and then another 0.1 mg in the morning when awakening. Continue  Ambien to 5 mg if possible. She is taking one half at bedtime and another half when she wakes  up to urinate later in the night.  Will report side effects or worsening symptoms Provided emergency contact information Reviewed PDMP   Debbie Flores, NP

## 2023-06-08 ENCOUNTER — Telehealth (HOSPITAL_BASED_OUTPATIENT_CLINIC_OR_DEPARTMENT_OTHER): Payer: Self-pay

## 2023-06-08 NOTE — Telephone Encounter (Signed)
   Pre-operative Risk Assessment    Patient Name: Debbie Norris  DOB: 03/11/1951 MRN: [id number]   Date of last office visit: 06/19/22 Date of next office visit: 06/15/23   Request for Surgical Clearance    Procedure:   Colonoscopy  Date of Surgery:  Clearance 06/15/23                                Surgeon:  Dr. Bernette Redbird Surgeon's Group or Practice Name:  [facility] Phone number:  [phone] Fax number:  [phone]   Type of Clearance Requested:   - Medical    Type of Anesthesia:   Propofol   Additional requests/questions:    Garrel Ridgel   06/08/2023, 2:11 PM

## 2023-06-08 NOTE — Telephone Encounter (Signed)
Preop APP asked me to confirm with the procedure date as there was some confusion if procedure on 06/14/23 or 06/15/23. Pt states procedure 06/14/23. I stated we will then need to move her in office appt sooner. I have scheduled pt to see Robin Searing, NP 06/11/23 @ 2:45. I will cancel the 06/15/23 appt with Perlie Gold, PAC.

## 2023-06-10 NOTE — Progress Notes (Unsigned)
Cardiology Office Note    Patient Name: Debbie Norris Date of Encounter: 06/10/2023  Primary Care Provider:  Farris Has, MD Primary Cardiologist:  Donato Schultz, MD Primary Electrophysiologist: None   Past Medical History    Past Medical History:  Diagnosis Date   Abdominal pain    Asthma    Atrophic vaginitis    Cancer (HCC)    basel cell   Complication of anesthesia    Depression    Diabetes (HCC)    High blood pressure    Hx of colonic polyps    Hyperlipidemia    Obesity    GASTRIC BYPASS IN 2006   PONV (postoperative nausea and vomiting)    with inhaled anesthetic    Vitamin D deficiency     History of Present Illness  Debbie Norris is a 73 y.o. female with a PMH of aortic insufficiency, HTN, HLD with statin intolerance, bifascicular block, PVCs DM type II who presents today for annual follow-up and preoperative clearance.  Debbie Norris was seen initially by Dr. Tenny Craw for management of AI and is currently followed by Dr. Anne Fu.  Completed her most recent 2D echo on 07/2021 that showed normal EF of 60 to 65% with mild LVH and no RWMA mild MVR and trivial AVR.  She completed a coronary calcium score showing nonobstructive disease with score of 83 and patient was referred to the lipid clinic for evaluation of PCSK9 due to statin intolerance.  She is currently on Repatha and was last seen by Dr. Anne Fu on 06/19/2022 and endorsed no chest pain and a few shortness of breath no other cardiac complaints.  She was found to have stable bifascicular block and stable PVCs but reported doing well on Repatha.  Ms. Bourassa presents today for preoperative clearance and follow-up.  She is scheduled to undergo a colonoscopy and EGD. The patient reports no new cardiac complaints and has been managing well on current medications, including Repatha for cholesterol management and Norvasc for Raynaud's disease. The patient reports occasional fatigue but is otherwise able to perform  daily activities without significant limitations. The patient also reports mild hydronephrosis and has an upcoming urology appointment. The patient has been on Repatha for two years and reports that cholesterol levels have improved, although total cholesterol remains slightly elevated. The patient also reports occasional changes in heart rhythm at night but no significant palpitations or dizziness.   Patient denies chest pain, palpitations, dyspnea, PND, orthopnea, nausea, vomiting, dizziness, syncope, edema, weight gain, or early satiety.  Review of Systems  Please see the history of present illness.    All other systems reviewed and are otherwise negative except as noted above. Prescription Physical Exam    Wt Readings from Last 3 Encounters:  05/11/23 145 lb 8.1 oz (66 kg)  03/08/23 147 lb (66.7 kg)  06/19/22 147 lb 12.8 oz (67 kg)   ZO:[id number] were no vitals filed for this visit.,There is no height or weight on file to calculate BMI. GEN: Well nourished, well developed in no acute distress Neck: No JVD; No carotid bruits Pulmonary: Clear to auscultation without rales, wheezing or rhonchi  Cardiovascular: Normal rate. Regular rhythm. Normal S1. Normal S2.   Murmurs: There is no murmur.  ABDOMEN: Soft, non-tender, non-distended EXTREMITIES:  No edema; No deformity   EKG/LABS/ Recent Cardiac Studies   ECG personally reviewed by me today -none completed today  Risk Assessment/Calculations:          Lab Results  Component Value Date  WBC 7.0 01/02/2017   HGB 12.5 01/02/2017   HCT 38.4 01/02/2017   MCV 86.7 01/02/2017   PLT 253.0 01/02/2017   Lab Results  Component Value Date   CREATININE 1.18 (H) 04/21/2019   BUN 16 04/21/2019   NA 139 04/21/2019   K 4.9 04/21/2019   CL 103 04/21/2019   CO2 23 04/21/2019   Lab Results  Component Value Date   CHOL 193 10/31/2021   HDL 111 10/31/2021   LDLCALC 63 10/31/2021   TRIG 111 10/31/2021   CHOLHDL 1.7 10/31/2021    No  results found for: "HGBA1C" Assessment & Plan    1.  Preoperative clearance: -Patient's RCRI score is 0.4% The patient affirms she has been doing well without any new cardiac symptoms. They are able to achieve 7 METS without cardiac limitations. Therefore, based on ACC/AHA guidelines, the patient would be at acceptable risk for the planned procedure without further cardiovascular testing. The patient was advised that if she develops new symptoms prior to surgery to contact our office to arrange for a follow-up visit, and she verbalized understanding.   2.  Nonrheumatic AI: -Mild mitral and aortic regurgitation noted on echocardiogram in 2023. No new symptoms of heart failure. -Plan for repeat echocardiogram in 1-2 years unless new symptoms develop.  3.  History of bifascicular block: -Stable on EKG with no new symptoms. Occasional PVCs noted. -Continue annual EKG monitoring. -Report any significant changes in energy level or tolerance.  4.  Coronary calcifications: -Non-obstructive coronary calcification noted on previous imaging. LDL controlled with Repatha (PCSK9 inhibitor). -Continue Repatha.  5.  Hyperlipidemia: -Continue Repatha as prescribed  6. Raynaud's Phenomenon Symptoms exacerbated by cold temperatures. Currently managed with Norvasc 2.5mg . -Continue Norvasc 2.5mg .  Disposition: Follow-up with Donato Schultz, MD or APP in 12 months   Signed, Napoleon Form, Leodis Rains, NP 06/10/2023, 5:25 PM [facility]

## 2023-06-11 ENCOUNTER — Ambulatory Visit: Payer: Medicare Other | Attending: Nurse Practitioner | Admitting: Nurse Practitioner

## 2023-06-11 ENCOUNTER — Encounter: Payer: Self-pay | Admitting: Nurse Practitioner

## 2023-06-11 VITALS — BP 109/60 | HR 72 | Ht 61.0 in | Wt 143.0 lb

## 2023-06-11 DIAGNOSIS — I452 Bifascicular block: Secondary | ICD-10-CM | POA: Diagnosis not present

## 2023-06-11 DIAGNOSIS — E7841 Elevated Lipoprotein(a): Secondary | ICD-10-CM | POA: Insufficient documentation

## 2023-06-11 DIAGNOSIS — E7800 Pure hypercholesterolemia, unspecified: Secondary | ICD-10-CM | POA: Diagnosis not present

## 2023-06-11 DIAGNOSIS — Z01810 Encounter for preprocedural cardiovascular examination: Secondary | ICD-10-CM | POA: Diagnosis not present

## 2023-06-11 DIAGNOSIS — I351 Nonrheumatic aortic (valve) insufficiency: Secondary | ICD-10-CM | POA: Insufficient documentation

## 2023-06-11 DIAGNOSIS — I493 Ventricular premature depolarization: Secondary | ICD-10-CM | POA: Insufficient documentation

## 2023-06-11 MED ORDER — REPATHA SURECLICK 140 MG/ML ~~LOC~~ SOAJ: 140.0000 mg | SUBCUTANEOUS | 11 refills | Status: AC

## 2023-06-11 NOTE — Patient Instructions (Addendum)
Medication Instructions:  Your physician recommends that you continue on your current medications as directed. Please refer to the Current Medication list given to you today.  *If you need a refill on your cardiac medications before your next appointment, please call your pharmacy*  Lab Work: None ordered.  If you have labs (blood work) drawn today and your tests are completely normal, you will receive your results only by: MyChart Message (if you have MyChart) OR A paper copy in the mail If you have any lab test that is abnormal or we need to change your treatment, we will call you to review the results.  Testing/Procedures: None ordered.  Follow-Up: At [facility], you and your health needs are our priority.  As part of our continuing mission to provide you with exceptional heart care, we have created designated Provider Care Teams.  These Care Teams include your primary Cardiologist (physician) and Advanced Practice Providers (APPs -  Physician Assistants and Nurse Practitioners) who all work together to provide you with the care you need, when you need it.   Your next appointment:   1 year(s)  The format for your next appointment:   In Person  Provider:   Dr Anne Fu or Robin Searing, NP  Important Information About Sugar

## 2023-06-14 DIAGNOSIS — K219 Gastro-esophageal reflux disease without esophagitis: Secondary | ICD-10-CM | POA: Diagnosis not present

## 2023-06-14 DIAGNOSIS — Z9884 Bariatric surgery status: Secondary | ICD-10-CM | POA: Diagnosis not present

## 2023-06-14 DIAGNOSIS — Z860101 Personal history of adenomatous and serrated colon polyps: Secondary | ICD-10-CM | POA: Diagnosis not present

## 2023-06-14 DIAGNOSIS — Z09 Encounter for follow-up examination after completed treatment for conditions other than malignant neoplasm: Secondary | ICD-10-CM | POA: Diagnosis not present

## 2023-06-15 ENCOUNTER — Ambulatory Visit: Payer: Medicare Other | Admitting: Cardiology

## 2023-06-18 ENCOUNTER — Other Ambulatory Visit (HOSPITAL_BASED_OUTPATIENT_CLINIC_OR_DEPARTMENT_OTHER): Payer: Self-pay

## 2023-06-18 DIAGNOSIS — K219 Gastro-esophageal reflux disease without esophagitis: Secondary | ICD-10-CM | POA: Diagnosis not present

## 2023-06-18 DIAGNOSIS — R110 Nausea: Secondary | ICD-10-CM | POA: Diagnosis not present

## 2023-06-21 DIAGNOSIS — N201 Calculus of ureter: Secondary | ICD-10-CM | POA: Diagnosis not present

## 2023-06-21 DIAGNOSIS — N3020 Other chronic cystitis without hematuria: Secondary | ICD-10-CM | POA: Diagnosis not present

## 2023-06-21 DIAGNOSIS — N130 Hydronephrosis with ureteropelvic junction obstruction: Secondary | ICD-10-CM | POA: Diagnosis not present

## 2023-06-26 ENCOUNTER — Other Ambulatory Visit (HOSPITAL_COMMUNITY): Payer: Self-pay | Admitting: Urology

## 2023-06-26 DIAGNOSIS — N1330 Unspecified hydronephrosis: Secondary | ICD-10-CM

## 2023-07-08 ENCOUNTER — Other Ambulatory Visit: Payer: Self-pay | Admitting: Behavioral Health

## 2023-07-09 ENCOUNTER — Other Ambulatory Visit (HOSPITAL_COMMUNITY): Payer: Medicare Other

## 2023-07-09 ENCOUNTER — Ambulatory Visit (HOSPITAL_COMMUNITY)
Admission: RE | Admit: 2023-07-09 | Discharge: 2023-07-09 | Disposition: A | Discharge status: Home / Self Care | Payer: Medicare Other | Source: Ambulatory Visit | Attending: Urology | Admitting: Urology

## 2023-07-09 DIAGNOSIS — Z0389 Encounter for observation for other suspected diseases and conditions ruled out: Secondary | ICD-10-CM | POA: Diagnosis not present

## 2023-07-09 DIAGNOSIS — N1330 Unspecified hydronephrosis: Secondary | ICD-10-CM | POA: Diagnosis not present

## 2023-07-09 MED ORDER — FUROSEMIDE 10 MG/ML IJ SOLN
32.5000 mg | Freq: Once | INTRAMUSCULAR | Status: AC
  Administered 2023-07-09: 32.5 mg via INTRAVENOUS

## 2023-07-09 MED ORDER — FUROSEMIDE 10 MG/ML IJ SOLN
INTRAMUSCULAR | Status: AC
  Filled 2023-07-09: qty 4

## 2023-07-09 MED ORDER — TECHNETIUM TC 99M MERTIATIDE
5.0100 | Freq: Once | INTRAVENOUS | Status: AC
  Administered 2023-07-09: 5.01 via INTRAVENOUS

## 2023-07-12 DIAGNOSIS — H16223 Keratoconjunctivitis sicca, not specified as Sjogren's, bilateral: Secondary | ICD-10-CM | POA: Diagnosis not present

## 2023-07-12 DIAGNOSIS — E119 Type 2 diabetes mellitus without complications: Secondary | ICD-10-CM | POA: Diagnosis not present

## 2023-07-12 DIAGNOSIS — H52203 Unspecified astigmatism, bilateral: Secondary | ICD-10-CM | POA: Diagnosis not present

## 2023-07-13 ENCOUNTER — Other Ambulatory Visit (HOSPITAL_COMMUNITY): Payer: Self-pay

## 2023-07-23 ENCOUNTER — Other Ambulatory Visit (HOSPITAL_BASED_OUTPATIENT_CLINIC_OR_DEPARTMENT_OTHER): Payer: Self-pay

## 2023-08-15 ENCOUNTER — Ambulatory Visit: Payer: Medicare Other | Admitting: Obstetrics and Gynecology

## 2023-08-20 ENCOUNTER — Encounter: Payer: Self-pay | Admitting: Behavioral Health

## 2023-08-20 ENCOUNTER — Ambulatory Visit: Payer: Medicare Other | Admitting: Behavioral Health

## 2023-08-20 DIAGNOSIS — F331 Major depressive disorder, recurrent, moderate: Secondary | ICD-10-CM

## 2023-08-20 DIAGNOSIS — F5105 Insomnia due to other mental disorder: Secondary | ICD-10-CM

## 2023-08-20 DIAGNOSIS — F411 Generalized anxiety disorder: Secondary | ICD-10-CM

## 2023-08-20 DIAGNOSIS — F410 Panic disorder [episodic paroxysmal anxiety] without agoraphobia: Secondary | ICD-10-CM

## 2023-08-20 DIAGNOSIS — F99 Mental disorder, not otherwise specified: Secondary | ICD-10-CM

## 2023-08-20 DIAGNOSIS — L821 Other seborrheic keratosis: Secondary | ICD-10-CM | POA: Diagnosis not present

## 2023-08-20 DIAGNOSIS — Z85828 Personal history of other malignant neoplasm of skin: Secondary | ICD-10-CM | POA: Diagnosis not present

## 2023-08-20 DIAGNOSIS — L814 Other melanin hyperpigmentation: Secondary | ICD-10-CM | POA: Diagnosis not present

## 2023-08-20 DIAGNOSIS — L820 Inflamed seborrheic keratosis: Secondary | ICD-10-CM | POA: Diagnosis not present

## 2023-08-20 DIAGNOSIS — L578 Other skin changes due to chronic exposure to nonionizing radiation: Secondary | ICD-10-CM | POA: Diagnosis not present

## 2023-08-20 MED ORDER — ZOLPIDEM TARTRATE 10 MG PO TABS: 5.0000 mg | ORAL_TABLET | Freq: Every evening | ORAL | 0 refills | Status: DC | PRN

## 2023-08-20 MED ORDER — HYDROXYZINE HCL 25 MG PO TABS: 25.0000 mg | ORAL_TABLET | Freq: Three times a day (TID) | ORAL | 0 refills | Status: DC | PRN

## 2023-08-20 NOTE — Progress Notes (Signed)
 [facility]  Patient ID: Debbie Norris,  MRN: [id number]  PCP: Farris Has, MD  Date of Evaluation: 08/20/2023 Time spent:30 minutes  Chief Complaint:  Chief Complaint   Depression; Anxiety; Follow-up; Medication Refill; Patient Education     HISTORY/CURRENT STATUS: HPI  "Debbie Norris", 73 year old female presents to this for follow up and medication management.  Collateral information should be considered reliable.  No changes since last visit. Says that she has some significant  improvement with anxiety and sleep since last visit. Still having relationship challenges with husband but they manage. Traveling to [place] soon for extended trip. Having to wake up several times per night to urinate. Following up with urology.  She is now getting average of 6-7 hours per night. She rates her anxiety today 3/10, and depression at 3/10.  She denies mania, no psychosis, no auditory or visual hallucinations.  No ETOH since December.  Says that she consumes at the same time every day usually around dinnertime.  She denies SI or HI.   Prior psychiatric medication trials: Prozac Hydroxyzine Ambien Xanax   Individual Medical History/ Review of Systems: Changes? :No   Allergies: Cream base, Ketorolac tromethamine, Pineapple, Sevoflurane, Atorvastatin, Benicar [olmesartan], Lisinopril, Metformin and related, Other, Phenergan [promethazine hcl], and Valdecoxib  Current Medications:  Current Outpatient Medications:    QUEtiapine (SEROQUEL) 25 MG tablet, TAKE 1 TABLET BY MOUTH EVERYDAY AT BEDTIME, Disp: 90 tablet, Rfl: 0   albuterol (PROVENTIL HFA) 108 (90 Base) MCG/ACT inhaler, TAKE 2 PUFFS BY MOUTH EVERY 4 TO 6 HOURS AS NEEDED, Disp: 6.7 each, Rfl: 11   ALPRAZolam (XANAX) 0.5 MG tablet, Take 1 tablet (0.5 mg total) by mouth 3 (three) times daily as needed., Disp: 90 tablet, Rfl: 2   amLODipine (NORVASC) 2.5 MG tablet, Take 1 tablet (2.5 mg total) by mouth daily., Disp: 90 tablet,  Rfl: 1   amoxicillin-clavulanate (AUGMENTIN) 875-125 MG tablet, Take 1 tablet by mouth as needed (sinus infection)., Disp: , Rfl:    Calcium Carbonate Antacid (TUMS PO), Take 1,000 mg by mouth 3 (three) times daily., Disp: , Rfl:    cetirizine (ZYRTEC) 10 MG chewable tablet, Chew 10 mg by mouth as needed for allergies or rhinitis., Disp: , Rfl:    cloNIDine (CATAPRES) 0.1 MG tablet, Take one tablet  by mouth at bedtime and then one tablet in the morning when wakening., Disp: 60 tablet, Rfl: 2   Coenzyme Q10 (Q-10 CO-ENZYME PO), Take 300 mg by mouth daily., Disp: , Rfl:    cyclobenzaprine (FLEXERIL) 10 MG tablet, Take 10 mg by mouth every 8 (eight) hours as needed for muscle spasms., Disp: , Rfl:    diclofenac sodium (VOLTAREN) 1 % GEL, As directed, Disp: , Rfl: 1   Evolocumab (REPATHA SURECLICK) 140 MG/ML SOAJ, Inject 140 mg into the skin every 14 (fourteen) days., Disp: 2 mL, Rfl: 11   Ferrous Sulfate (SLOW FE PO), Take 1 tablet by mouth daily., Disp: , Rfl:    fluconazole (DIFLUCAN) 150 MG tablet, Take 150 mg by mouth as needed (with antibiotics)., Disp: , Rfl:    FLUoxetine (PROZAC) 20 MG capsule, Take 1 capsule (20 mg total) by mouth daily., Disp: 90 capsule, Rfl: 1   FLUoxetine (PROZAC) 40 MG capsule, Take 1 capsule (40 mg total) by mouth daily., Disp: 90 capsule, Rfl: 1   fluticasone (FLONASE) 50 MCG/ACT nasal spray, Place 2 sprays into both nostrils 2 (two) times daily. , Disp: , Rfl:    hydrOXYzine (ATARAX) 25 MG tablet,  Take 1 tablet (25 mg total) by mouth 3 (three) times daily as needed., Disp: 270 tablet, Rfl: 0   hydrOXYzine (ATARAX) 50 MG tablet, TAKE 1/2 TO 1 TABLET BY MOUTH TWICE A DAY AS NEEDED FOR ANXIETY, Disp: 180 tablet, Rfl: 1   OZEMPIC, 0.25 OR 0.5 MG/DOSE, 2 MG/3ML SOPN, SMARTSIG:0.25 Milligram(s) SUB-Q Once a Week, Disp: , Rfl:    pantoprazole (PROTONIX) 40 MG tablet, Take 40 mg by mouth 2 (two) times daily at 10 am and 4 pm. , Disp: , Rfl:    QUEtiapine (SEROQUEL) 100 MG  tablet, Take 1/2-1 tablet by mouth at bedtime daily for sleep and anxiety, Disp: 90 tablet, Rfl: 1   Semaglutide (OZEMPIC, 0.25 OR 0.5 MG/DOSE, [place]), Inject 0.5 mg into the skin every 7 (seven) days., Disp: , Rfl:    Semaglutide-Weight Management 0.25 MG/0.5ML SOAJ, Inject 0.25 mg into the skin., Disp: , Rfl:    VAGIFEM 10 MCG TABS vaginal tablet, Place 1 tablet vaginally 2 (two) times a week. , Disp: , Rfl: 3   vitamin B-12 (CYANOCOBALAMIN) 1000 MCG tablet, Take 1,000 mcg by mouth daily., Disp: , Rfl:    Vitamin D, Ergocalciferol, (DRISDOL) 50000 UNITS CAPS capsule, Take 1,000 Units by mouth daily., Disp: , Rfl:    zolpidem (AMBIEN) 10 MG tablet, Take 0.5-1 tablets (5-10 mg total) by mouth at bedtime as needed., Disp: 90 tablet, Rfl: 0 Medication Side Effects: none  Family Medical/ Social History: Changes? No  MENTAL HEALTH EXAM:  There were no vitals taken for this visit.There is no height or weight on file to calculate BMI.  General Appearance: Casual and Well Groomed  Eye Contact:  NA  Speech:  Clear and Coherent  Volume:  Normal  Mood:  NA  Affect:  Appropriate  Thought Process:  Coherent  Orientation:  Full (Time, Place, and Person)  Thought Content: Logical   Suicidal Thoughts:  No  Homicidal Thoughts:  No  Memory:  WNL  Judgement:  Good  Insight:  Good  Psychomotor Activity:  Normal  Concentration:  Concentration: Good  Recall:  Good  Fund of Knowledge: Good  Language: Good  Assets:  Desire for Improvement  ADL's:  Intact  Cognition: WNL  Prognosis:  Good    DIAGNOSES:    ICD-10-CM   1. Generalized anxiety disorder  F41.1 hydrOXYzine (ATARAX) 25 MG tablet    2. Panic attack  F41.0     3. Major depressive disorder, recurrent episode, moderate (HCC)  F33.1     4. Insomnia due to other mental disorder  F51.05 zolpidem (AMBIEN) 10 MG tablet   F99       Receiving Psychotherapy: No    RECOMMENDATIONS:   Greater than 50% of 30 min face to face time with  patient was spent on counseling and coordination of care.  We reviewed her progress with anxiety and depression. Overall happy with her medications. Most of what she is experiencing is frustration with marital communication and dynamics in which I recommended therapy.  She is requesting no changes with medication this visit.    We agreed to:   To continue Prozac to 60 mg daily in the a.m. To continue Xanax 0.5 mg 2 times daily as needed only for severe anxiety.   Continue Seroquel 25 mg at bedtime. May increase to 50 mg if not effective. To continue hydroxyzine 25 mg 3 times daily as needed To continue clonidine 0.1 mg at bedtime, and then another 0.1 mg in the morning when  awakening. Continue  Ambien to 5 mg if possible. She is taking one half at bedtime and another half when she wakes up to urinate later in the night.  Will report side effects or worsening symptoms Provided emergency contact information Reviewed PDMP   Lincoln Renshaw, NP

## 2023-08-21 ENCOUNTER — Other Ambulatory Visit (HOSPITAL_BASED_OUTPATIENT_CLINIC_OR_DEPARTMENT_OTHER): Payer: Self-pay

## 2023-08-21 DIAGNOSIS — I10 Essential (primary) hypertension: Secondary | ICD-10-CM | POA: Diagnosis not present

## 2023-08-21 DIAGNOSIS — K219 Gastro-esophageal reflux disease without esophagitis: Secondary | ICD-10-CM | POA: Diagnosis not present

## 2023-08-21 DIAGNOSIS — E1169 Type 2 diabetes mellitus with other specified complication: Secondary | ICD-10-CM | POA: Diagnosis not present

## 2023-08-21 DIAGNOSIS — E785 Hyperlipidemia, unspecified: Secondary | ICD-10-CM | POA: Diagnosis not present

## 2023-08-23 DIAGNOSIS — N130 Hydronephrosis with ureteropelvic junction obstruction: Secondary | ICD-10-CM | POA: Diagnosis not present

## 2023-08-23 DIAGNOSIS — N3020 Other chronic cystitis without hematuria: Secondary | ICD-10-CM | POA: Diagnosis not present

## 2023-09-10 ENCOUNTER — Other Ambulatory Visit: Payer: Self-pay | Admitting: Behavioral Health

## 2023-09-10 DIAGNOSIS — F410 Panic disorder [episodic paroxysmal anxiety] without agoraphobia: Secondary | ICD-10-CM

## 2023-09-10 DIAGNOSIS — F411 Generalized anxiety disorder: Secondary | ICD-10-CM

## 2023-09-11 ENCOUNTER — Telehealth: Payer: Self-pay

## 2023-09-11 DIAGNOSIS — L814 Other melanin hyperpigmentation: Secondary | ICD-10-CM | POA: Diagnosis not present

## 2023-09-11 DIAGNOSIS — L821 Other seborrheic keratosis: Secondary | ICD-10-CM | POA: Diagnosis not present

## 2023-09-11 DIAGNOSIS — D485 Neoplasm of uncertain behavior of skin: Secondary | ICD-10-CM | POA: Diagnosis not present

## 2023-09-11 DIAGNOSIS — L820 Inflamed seborrheic keratosis: Secondary | ICD-10-CM | POA: Diagnosis not present

## 2023-09-11 NOTE — Telephone Encounter (Signed)
 Prior authorization submitted for Hydroxyzine  25 mg tablets #270/90 day with [employer]. Approval received effective 05/09/23 through 09/10/24.

## 2023-10-02 ENCOUNTER — Other Ambulatory Visit: Payer: Self-pay | Admitting: Behavioral Health

## 2023-10-02 DIAGNOSIS — F411 Generalized anxiety disorder: Secondary | ICD-10-CM

## 2023-10-02 DIAGNOSIS — F5105 Insomnia due to other mental disorder: Secondary | ICD-10-CM

## 2023-10-19 DIAGNOSIS — M9901 Segmental and somatic dysfunction of cervical region: Secondary | ICD-10-CM | POA: Diagnosis not present

## 2023-10-19 DIAGNOSIS — M9902 Segmental and somatic dysfunction of thoracic region: Secondary | ICD-10-CM | POA: Diagnosis not present

## 2023-10-19 DIAGNOSIS — M9903 Segmental and somatic dysfunction of lumbar region: Secondary | ICD-10-CM | POA: Diagnosis not present

## 2023-10-19 DIAGNOSIS — M5126 Other intervertebral disc displacement, lumbar region: Secondary | ICD-10-CM | POA: Diagnosis not present

## 2023-12-04 ENCOUNTER — Telehealth: Payer: Self-pay | Admitting: Behavioral Health

## 2023-12-04 DIAGNOSIS — B9620 Unspecified Escherichia coli [E. coli] as the cause of diseases classified elsewhere: Secondary | ICD-10-CM | POA: Diagnosis not present

## 2023-12-04 DIAGNOSIS — T17998A Other foreign object in respiratory tract, part unspecified causing other injury, initial encounter: Secondary | ICD-10-CM | POA: Diagnosis not present

## 2023-12-04 DIAGNOSIS — G4700 Insomnia, unspecified: Secondary | ICD-10-CM | POA: Diagnosis not present

## 2023-12-04 DIAGNOSIS — E119 Type 2 diabetes mellitus without complications: Secondary | ICD-10-CM | POA: Diagnosis not present

## 2023-12-04 DIAGNOSIS — R0781 Pleurodynia: Secondary | ICD-10-CM | POA: Diagnosis not present

## 2023-12-04 DIAGNOSIS — M25551 Pain in right hip: Secondary | ICD-10-CM | POA: Diagnosis not present

## 2023-12-04 DIAGNOSIS — Z9884 Bariatric surgery status: Secondary | ICD-10-CM | POA: Diagnosis not present

## 2023-12-04 DIAGNOSIS — S20211A Contusion of right front wall of thorax, initial encounter: Secondary | ICD-10-CM | POA: Diagnosis not present

## 2023-12-04 DIAGNOSIS — N898 Other specified noninflammatory disorders of vagina: Secondary | ICD-10-CM | POA: Diagnosis not present

## 2023-12-04 DIAGNOSIS — G8929 Other chronic pain: Secondary | ICD-10-CM | POA: Diagnosis not present

## 2023-12-04 DIAGNOSIS — A419 Sepsis, unspecified organism: Secondary | ICD-10-CM | POA: Diagnosis not present

## 2023-12-04 DIAGNOSIS — R911 Solitary pulmonary nodule: Secondary | ICD-10-CM | POA: Diagnosis not present

## 2023-12-04 DIAGNOSIS — R058 Other specified cough: Secondary | ICD-10-CM | POA: Diagnosis not present

## 2023-12-04 DIAGNOSIS — N12 Tubulo-interstitial nephritis, not specified as acute or chronic: Secondary | ICD-10-CM | POA: Diagnosis not present

## 2023-12-04 DIAGNOSIS — I440 Atrioventricular block, first degree: Secondary | ICD-10-CM | POA: Diagnosis not present

## 2023-12-04 DIAGNOSIS — M419 Scoliosis, unspecified: Secondary | ICD-10-CM | POA: Diagnosis not present

## 2023-12-04 DIAGNOSIS — R509 Fever, unspecified: Secondary | ICD-10-CM | POA: Diagnosis not present

## 2023-12-04 DIAGNOSIS — K219 Gastro-esophageal reflux disease without esophagitis: Secondary | ICD-10-CM | POA: Diagnosis not present

## 2023-12-04 DIAGNOSIS — N390 Urinary tract infection, site not specified: Secondary | ICD-10-CM | POA: Diagnosis not present

## 2023-12-04 DIAGNOSIS — Z9049 Acquired absence of other specified parts of digestive tract: Secondary | ICD-10-CM | POA: Diagnosis not present

## 2023-12-04 DIAGNOSIS — I4510 Unspecified right bundle-branch block: Secondary | ICD-10-CM | POA: Diagnosis not present

## 2023-12-04 DIAGNOSIS — R7881 Bacteremia: Secondary | ICD-10-CM | POA: Diagnosis not present

## 2023-12-04 NOTE — Telephone Encounter (Signed)
 Called patient again and husband answered the phone. She took the phone and said she was going to pick up her medication and then a friend was going to take her to the ER in [facility]. Husband took the phone back and said that was all he could say at this time.

## 2023-12-04 NOTE — Telephone Encounter (Signed)
 Called patient and reception was bad. She was in the car and said she would turn off bluetooth and then I was disconnected. Tried to call back and it goes straight to VM.

## 2023-12-04 NOTE — Telephone Encounter (Signed)
 Addendum to previous message. Arfa called and wants to know how serious her side effects are taking Levoquin and is it serious side effects? Phone number is [phone].

## 2023-12-04 NOTE — Telephone Encounter (Signed)
 Pt has been prescribed Levofloxacine 750mg  for 5 days for a UTI and was told to check with psych provider for medication complications.

## 2023-12-06 NOTE — Telephone Encounter (Signed)
 Called husband to check on patient. He reports she went to the hospital for physical illness and not MH reasons. Reporting her VS were better and she should be discharged tomorrow.

## 2023-12-07 ENCOUNTER — Other Ambulatory Visit: Payer: Self-pay | Admitting: Behavioral Health

## 2023-12-07 ENCOUNTER — Ambulatory Visit: Admitting: Behavioral Health

## 2023-12-07 DIAGNOSIS — F411 Generalized anxiety disorder: Secondary | ICD-10-CM

## 2023-12-07 DIAGNOSIS — F5105 Insomnia due to other mental disorder: Secondary | ICD-10-CM

## 2023-12-11 DIAGNOSIS — R911 Solitary pulmonary nodule: Secondary | ICD-10-CM | POA: Diagnosis not present

## 2023-12-11 DIAGNOSIS — R051 Acute cough: Secondary | ICD-10-CM | POA: Diagnosis not present

## 2023-12-11 DIAGNOSIS — N390 Urinary tract infection, site not specified: Secondary | ICD-10-CM | POA: Diagnosis not present

## 2023-12-12 DIAGNOSIS — R051 Acute cough: Secondary | ICD-10-CM | POA: Diagnosis not present

## 2023-12-20 DIAGNOSIS — D649 Anemia, unspecified: Secondary | ICD-10-CM | POA: Diagnosis not present

## 2023-12-20 DIAGNOSIS — A419 Sepsis, unspecified organism: Secondary | ICD-10-CM | POA: Diagnosis not present

## 2023-12-20 DIAGNOSIS — R911 Solitary pulmonary nodule: Secondary | ICD-10-CM | POA: Diagnosis not present

## 2023-12-20 DIAGNOSIS — R399 Unspecified symptoms and signs involving the genitourinary system: Secondary | ICD-10-CM | POA: Diagnosis not present

## 2023-12-20 DIAGNOSIS — N12 Tubulo-interstitial nephritis, not specified as acute or chronic: Secondary | ICD-10-CM | POA: Diagnosis not present

## 2023-12-20 DIAGNOSIS — E1165 Type 2 diabetes mellitus with hyperglycemia: Secondary | ICD-10-CM | POA: Diagnosis not present

## 2023-12-20 DIAGNOSIS — N3000 Acute cystitis without hematuria: Secondary | ICD-10-CM | POA: Diagnosis not present

## 2023-12-20 DIAGNOSIS — Z09 Encounter for follow-up examination after completed treatment for conditions other than malignant neoplasm: Secondary | ICD-10-CM | POA: Diagnosis not present

## 2023-12-21 ENCOUNTER — Ambulatory Visit: Admitting: Behavioral Health

## 2023-12-27 ENCOUNTER — Other Ambulatory Visit: Payer: Self-pay | Admitting: Family Medicine

## 2023-12-27 DIAGNOSIS — K219 Gastro-esophageal reflux disease without esophagitis: Secondary | ICD-10-CM | POA: Diagnosis not present

## 2023-12-27 DIAGNOSIS — Z9049 Acquired absence of other specified parts of digestive tract: Secondary | ICD-10-CM | POA: Diagnosis not present

## 2023-12-27 DIAGNOSIS — Z1231 Encounter for screening mammogram for malignant neoplasm of breast: Secondary | ICD-10-CM

## 2023-12-27 DIAGNOSIS — R110 Nausea: Secondary | ICD-10-CM | POA: Diagnosis not present

## 2023-12-27 DIAGNOSIS — Z9884 Bariatric surgery status: Secondary | ICD-10-CM | POA: Diagnosis not present

## 2023-12-28 ENCOUNTER — Other Ambulatory Visit: Payer: Self-pay | Admitting: Behavioral Health

## 2023-12-28 ENCOUNTER — Ambulatory Visit
Admission: RE | Admit: 2023-12-28 | Discharge: 2023-12-28 | Disposition: A | Discharge status: Home / Self Care | Source: Ambulatory Visit | Attending: Family Medicine | Admitting: Family Medicine

## 2023-12-28 DIAGNOSIS — F5105 Insomnia due to other mental disorder: Secondary | ICD-10-CM

## 2023-12-28 DIAGNOSIS — Z1231 Encounter for screening mammogram for malignant neoplasm of breast: Secondary | ICD-10-CM

## 2023-12-28 DIAGNOSIS — F411 Generalized anxiety disorder: Secondary | ICD-10-CM

## 2023-12-29 ENCOUNTER — Other Ambulatory Visit: Payer: Self-pay | Admitting: Behavioral Health

## 2024-01-01 ENCOUNTER — Encounter

## 2024-01-01 DIAGNOSIS — J4489 Other specified chronic obstructive pulmonary disease: Secondary | ICD-10-CM

## 2024-01-15 ENCOUNTER — Other Ambulatory Visit: Payer: Self-pay | Admitting: Family Medicine

## 2024-01-15 DIAGNOSIS — R911 Solitary pulmonary nodule: Secondary | ICD-10-CM

## 2024-01-20 ENCOUNTER — Other Ambulatory Visit: Payer: Self-pay | Admitting: Behavioral Health

## 2024-01-20 DIAGNOSIS — F410 Panic disorder [episodic paroxysmal anxiety] without agoraphobia: Secondary | ICD-10-CM

## 2024-01-20 DIAGNOSIS — F411 Generalized anxiety disorder: Secondary | ICD-10-CM

## 2024-01-22 DIAGNOSIS — N3000 Acute cystitis without hematuria: Secondary | ICD-10-CM | POA: Diagnosis not present

## 2024-02-20 ENCOUNTER — Ambulatory Visit (INDEPENDENT_AMBULATORY_CARE_PROVIDER_SITE_OTHER): Admitting: Primary Care

## 2024-02-20 ENCOUNTER — Encounter: Payer: Self-pay | Admitting: Primary Care

## 2024-02-20 VITALS — BP 124/64 | HR 72 | Temp 97.1°F | Ht 61.0 in | Wt 151.6 lb

## 2024-02-20 DIAGNOSIS — J209 Acute bronchitis, unspecified: Secondary | ICD-10-CM

## 2024-02-20 DIAGNOSIS — J45901 Unspecified asthma with (acute) exacerbation: Secondary | ICD-10-CM | POA: Diagnosis not present

## 2024-02-20 MED ORDER — PREDNISONE 20 MG PO TABS: ORAL_TABLET | ORAL | 0 refills | Status: AC

## 2024-02-20 MED ORDER — AMOXICILLIN-POT CLAVULANATE 875-125 MG PO TABS
1.0000 | ORAL_TABLET | Freq: Two times a day (BID) | ORAL | 0 refills | Status: AC
Start: 2024-02-20 — End: ?

## 2024-02-20 NOTE — Patient Instructions (Addendum)
 VISIT SUMMARY: During your visit, we discussed your ongoing congestive cough, which started after exposure to dust while mowing and mulching leaves. We also reviewed your history of severe GERD and the pulmonary nodule found during your recent hospitalization. A treatment plan was created to address these issues.  YOUR PLAN: -ACUTE BRONCHITIS WITH ASTHMA EXACERBATION: You have acute bronchitis, which is an inflammation of the bronchial tubes, and it is being worsened by your asthma. This is causing your congestive cough and wheezing. You will take prednisone 20 mg for 5 days and continue using your albuterol  inhaler as needed to help manage these symptoms.  -GASTROESOPHAGEAL REFLUX DISEASE (GERD) AFTER GASTRIC BYPASS: GERD is a condition where stomach acid frequently flows back into the tube connecting your mouth and stomach, causing irritation. Your GERD is being managed with pantoprazole and Carafate, but you should continue to avoid foods and drinks that trigger your symptoms, such as citrus, spicy foods, chocolate, and caffeine. Additionally, do not lie down for two hours after eating. We may consider adding famotidine at bedtime to help manage your symptoms.  Follow GERD diet DO not eat 2 hours before bed or lay down after eating Sleep with head elevated  Continue carafate as directed  Speak with GI about switching pantoprazole to Famotidine (pepcid) if still having reflux symptoms (GLP medication can contribute to reflux)   -PULMONARY NODULE, RIGHT UPPER LOBE, UNDER SURVEILLANCE: A pulmonary nodule is a small growth in the lung. The nodule found in your right upper lobe during your recent hospitalization is being monitored. A follow-up CT scan is scheduled for November 3rd to check for any changes.  INSTRUCTIONS: Please follow up with your CT scan on November 3rd to monitor the pulmonary nodule. Continue taking your medications as prescribed and follow the dietary and lifestyle  recommendations provided.  Follow-up: As scheduled with Dr. Zaida    GERD in Adults: Diet Changes When you have gastroesophageal reflux disease (GERD), you may need to make changes to your diet. Choosing the right foods can help with your symptoms. Think about working with an expert in healthy eating called a dietitian. They can help you make healthy food choices. What are tips for following this plan? Reading food labels Look for foods that are low in saturated fat. Foods that may help with your symptoms include: Foods with less than 5% of daily value (DV) of fat. Foods with 0 grams of trans fat. Cooking [employer] in ways that don't use a lot of fat. These ways include: Baking. Steaming. Grilling. Broiling. To add flavor, try to use herbs that are low in spice and acidity. Avoid frying your food. Meal planning  Eat small meals often rather than eating 3 large meals each day. Eat your meals slowly in a place where you feel relaxed. If told by your health care provider, avoid: Foods that cause symptoms. Keep a food diary to keep track of foods that cause symptoms. Alcohol. Drinking a lot of liquid with meals. General instructions For 2-3 hours after you eat, avoid: Bending over. Exercise. Lying down. Chew sugar-free gum after meals. What foods should I eat? Eat a healthy diet. Try to include: Foods with high amounts of fiber. These include: Fruits and vegetables. Whole grains and beans. Low-fat dairy products. Lean meats, fish, and poultry. Egg whites. Foods that cause symptoms in someone else may not cause symptoms for you. Work with your provider to find foods that are safe for you. The items listed above may not  be all the foods and drinks you can have. Talk with a dietitian to learn more. The items listed above may not be a complete list of foods and beverages you can eat and drink. Contact a dietitian for more information. What foods should I avoid? Limiting  some of these foods may help with your symptoms. Each person is different. Talk with a dietitian or your provider to help you find the exact foods to avoid. Some of the foods to avoid may include: Fruits Fruits with a lot of acid in them. These may include citrus fruits, such as oranges, grapefruit, pineapple, and lemons. Vegetables Deep-fried vegetables, such as [place] fries. Vegetables, sauces, or toppings made with added fat and vegetables with acid in them. These may include tomatoes and tomato products, chili peppers, onions, garlic, and horseradish. Grains Pastries or quick breads with added fat. Meats and other proteins High-fat meats, such as fatty beef or pork, hot dogs, ribs, ham, sausage, salami, and bacon. Fried meat or protein, such as fried fish and fried chicken. Egg yolks. Fats and oils Butter. Margarine. Shortening. Ghee. Drinks Coffee and other drinks with caffeine in them. Fizzy and sugary drinks, such as soda and energy drinks. Fruit juice made with acidic fruits, such as orange or grapefruit. Tomato juice. Sweets and desserts Chocolate and cocoa. Donuts. Seasonings and condiments Mint, such as peppermint and spearmint. Condiments, herbs, or seasonings that cause symptoms. These may include curry, hot sauce, or vinegar-based salad dressings. The items listed above may not be all the foods and drinks you should avoid. Talk with a dietitian to learn more. Questions to ask your health care provider Changes to your diet and everyday life are often the first steps taken to manage symptoms of GERD. If these changes don't help, talk with your provider about taking medicines. Where to find more information [employer]: aboutgerd.org This information is not intended to replace advice given to you by your health care provider. Make sure you discuss any questions you have with your health care provider. Document Revised: 03/06/2023  Document Reviewed: 09/20/2022 Elsevier Patient Education  2024 [employer].

## 2024-02-20 NOTE — Progress Notes (Signed)
 @Patient  ID: Debbie Norris, female    DOB: 12-Aug-1950, 73 y.o.   MRN: [id number]  Chief Complaint  Patient presents with   Cough    Wet, greenish-yellow. X3 weeks. Denies chest pain, possible low grade fever.     Referring provider: Kip Righter, MD  HPI: 73 year old female, never smoked. PMH significant for asthma, allergic rhinitis  02/20/2024 Discussed the use of AI scribe software for clinical note transcription with the patient, who gave verbal consent to proceed.  History of Present Illness SKY PRIMO is a 73 year old female with asthmatic bronchitis who presents with a congestive cough.  She has been experiencing a congestive cough since January 26, 2024, after exposure to dust while mowing and mulching leaves. The cough is productive, yielding yellow-green sputum, although the quantity has decreased recently. She has been using an albuterol  inhaler approximately eight times in the last week, which helps expectorate mucus, but she does not use it regularly. No chest tightness is reported, but she experiences wheezing, particularly in the mornings, and her ears are popping.  She has a history of severe gastroesophageal reflux disease (GERD) and underwent gastric bypass surgery 20 years ago. She occasionally experiences aspiration, leading to coughing episodes. Her GERD is managed with pantoprazole twice daily and Carafate once during the night. She also has a history of sinus infections, which she manages with nasal flushing and other preventive measures.  She was hospitalized from July 28 to December 07, 2023, for renal sepsis, during which a nodule was found in her right upper lobe. A follow-up CT scan is scheduled for March 10, 2024. She will establish with new pulmonologist Dr. Donzetta after repeat CT imaging.   She has a history of diabetes, managed with Ozempic , which maintains stable blood sugar levels. She also takes clonidine  for anxiety.   Allergies   Allergen Reactions   Cream Base Rash   Ketorolac Tromethamine Other (See Comments)    swelling   Pineapple Hives and Other (See Comments)    Stomach cramps   Sevoflurane Nausea And Vomiting    Patient states had immediate projectile vomiting.  1998 and 2013   Atorvastatin Other (See Comments)    Muscle pain, high level CPK   Benicar [Olmesartan]    Lisinopril Cough, Other (See Comments) and Nausea And Vomiting   Metformin And Related     Muscle pain/cramps   Other     All vaginal / genital creams: causes severe rash with itching   Phenergan [Promethazine Hcl]     Itchy    Valdecoxib Swelling and Other (See Comments)    Immunization History  Administered Date(s) Administered   Fluad Quad(high Dose 65+) 02/14/2022   Fluad Trivalent(High Dose 65+) 03/08/2023   INFLUENZA, HIGH DOSE SEASONAL PF 02/06/2018, 01/28/2019   Influenza Split 02/05/2013, 02/26/2015   Influenza,inj,quad, With Preservative 02/05/2017   Influenza-Unspecified 01/20/2014, 01/07/2015   PFIZER(Purple Top)SARS-COV-2 Vaccination 06/13/2019, 07/08/2019, 01/05/2020   Pfizer Covid-19 Vaccine Bivalent Booster 39yrs & up 03/15/2021   Pneumococcal Conjugate-13 01/06/2014   Pneumococcal Polysaccharide-23 05/08/2008   Pneumococcal-Unspecified 01/06/2014    Past Medical History:  Diagnosis Date   Abdominal pain    Asthma    Atrophic vaginitis    Cancer (HCC)    basel cell   Complication of anesthesia    Depression    Diabetes (HCC)    High blood pressure    Hx of colonic polyps    Hyperlipidemia    Obesity  GASTRIC BYPASS IN 2006   PONV (postoperative nausea and vomiting)    with inhaled anesthetic    Vitamin D deficiency     Tobacco History: Social History   Tobacco Use  Smoking Status Never  Smokeless Tobacco Never   Counseling given: Not Answered   Outpatient Medications Prior to Visit  Medication Sig Dispense Refill   albuterol  (PROVENTIL  HFA) 108 (90 Base) MCG/ACT inhaler TAKE 2 PUFFS  BY MOUTH EVERY 4 TO 6 HOURS AS NEEDED 6.7 each 11   ALPRAZolam  (XANAX ) 0.5 MG tablet TAKE 1 TABLET BY MOUTH THREE TIMES A DAY AS NEEDED 90 tablet 1   amoxicillin -clavulanate (AUGMENTIN ) 875-125 MG tablet Take 1 tablet by mouth as needed (sinus infection).     Calcium  Carbonate Antacid (TUMS PO) Take 1,000 mg by mouth 3 (three) times daily.     cetirizine (ZYRTEC) 10 MG chewable tablet Chew 10 mg by mouth as needed for allergies or rhinitis.     cloNIDine  (CATAPRES ) 0.1 MG tablet TAKE ONE TABLET BY MOUTH AT BEDTIME AND THEN ONE TABLET IN THE MORNING WHEN WAKENING. 180 tablet 0   Coenzyme Q10 (Q-10 CO-ENZYME PO) Take 300 mg by mouth daily.     cyclobenzaprine (FLEXERIL) 10 MG tablet Take 10 mg by mouth every 8 (eight) hours as needed for muscle spasms.     diclofenac sodium (VOLTAREN) 1 % GEL As directed  1   Evolocumab  (REPATHA  SURECLICK) 140 MG/ML SOAJ Inject 140 mg into the skin every 14 (fourteen) days. 2 mL 11   Ferrous Sulfate (SLOW FE PO) Take 1 tablet by mouth daily.     fluconazole  (DIFLUCAN ) 150 MG tablet Take 150 mg by mouth as needed (with antibiotics).     FLUoxetine  (PROZAC ) 20 MG capsule Take 1 capsule (20 mg total) by mouth daily. 90 capsule 1   FLUoxetine  (PROZAC ) 40 MG capsule Take 1 capsule (40 mg total) by mouth daily. 90 capsule 1   fluticasone  (FLONASE) 50 MCG/ACT nasal spray Place 2 sprays into both nostrils 2 (two) times daily.      hydrOXYzine  (ATARAX ) 25 MG tablet TAKE 1 TABLET BY MOUTH THREE TIMES A DAY AS NEEDED 270 tablet 0   pantoprazole (PROTONIX) 40 MG tablet Take 40 mg by mouth 2 (two) times daily at 10 am and 4 pm.      QUEtiapine  (SEROQUEL ) 100 MG tablet Take 1/2-1 tablet by mouth at bedtime daily for sleep and anxiety 90 tablet 1   QUEtiapine  (SEROQUEL ) 25 MG tablet TAKE 1 TABLET BY MOUTH EVERYDAY AT BEDTIME 90 tablet 0   Semaglutide -Weight Management 0.25 MG/0.5ML SOAJ Inject 0.25 mg into the skin.     VAGIFEM 10 MCG TABS vaginal tablet Place 1 tablet vaginally  2 (two) times a week.   3   vitamin B-12 (CYANOCOBALAMIN ) 1000 MCG tablet Take 1,000 mcg by mouth daily.     Vitamin D, Ergocalciferol, (DRISDOL) 50000 UNITS CAPS capsule Take 1,000 Units by mouth daily.     hydrOXYzine  (ATARAX ) 50 MG tablet TAKE 1/2 TO 1 TABLET BY MOUTH TWICE A DAY AS NEEDED FOR ANXIETY (Patient not taking: Reported on 02/20/2024) 180 tablet 1   OZEMPIC , 0.25 OR 0.5 MG/DOSE, 2 MG/3ML SOPN SMARTSIG:0.25 Milligram(s) SUB-Q Once a Week (Patient not taking: Reported on 02/20/2024)     Semaglutide  (OZEMPIC , 0.25 OR 0.5 MG/DOSE, [place]) Inject 0.5 mg into the skin every 7 (seven) days. (Patient not taking: Reported on 02/20/2024)     zolpidem  (AMBIEN ) 10 MG tablet TAKE 0.5-1  TABLETS (5-10 MG TOTAL) BY MOUTH AT BEDTIME AS NEEDED. (Patient not taking: Reported on 02/20/2024) 90 tablet 0   No facility-administered medications prior to visit.   Review of Systems  Review of Systems  Constitutional: Negative.   HENT:  Positive for congestion.   Respiratory:  Positive for cough and wheezing.   Cardiovascular: Negative.    Physical Exam  BP 124/64   Pulse 72   Temp (!) 97.1 F (36.2 C)   Ht 5' 1 (1.549 m)   Wt 151 lb 9.6 oz (68.8 kg)   SpO2 97% Comment: ra  BMI 28.64 kg/m  Physical Exam Constitutional:      Appearance: Normal appearance.  HENT:     Head: Normocephalic and atraumatic.  Cardiovascular:     Rate and Rhythm: Normal rate and regular rhythm.  Pulmonary:     Effort: Pulmonary effort is normal.     Breath sounds: Normal breath sounds.  Musculoskeletal:        General: Normal range of motion.  Skin:    General: Skin is warm and dry.  Neurological:     General: No focal deficit present.     Mental Status: She is alert and oriented to person, place, and time. Mental status is at baseline.  Psychiatric:        Mood and Affect: Mood normal.        Behavior: Behavior normal.        Thought Content: Thought content normal.        Judgment: Judgment normal.      Lab Results:  CBC    Component Value Date/Time   WBC 7.0 01/02/2017 1047   RBC 4.43 01/02/2017 1047   HGB 12.5 01/02/2017 1047   HCT 38.4 01/02/2017 1047   PLT 253.0 01/02/2017 1047   MCV 86.7 01/02/2017 1047   MCHC 32.6 01/02/2017 1047   RDW 14.2 01/02/2017 1047   LYMPHSABS 2.1 01/02/2017 1047   MONOABS 0.6 01/02/2017 1047   EOSABS 0.2 01/02/2017 1047   BASOSABS 0.1 01/02/2017 1047    BMET    Component Value Date/Time   NA 139 04/21/2019 1207   K 4.9 04/21/2019 1207   CL 103 04/21/2019 1207   CO2 23 04/21/2019 1207   GLUCOSE 157 (H) 04/21/2019 1207   BUN 16 04/21/2019 1207   CREATININE 1.18 (H) 04/21/2019 1207   CALCIUM  9.1 04/21/2019 1207   GFRNONAA 48 (L) 04/21/2019 1207   GFRAA 55 (L) 04/21/2019 1207    BNP No results found for: BNP  ProBNP No results found for: PROBNP  Imaging: No results found.   Assessment & Plan:   1. Acute bronchitis with asthma with acute exacerbation (Primary)  Assessment & Plan Acute bronchitis with asthma exacerbation Presents with a congestive cough persisting since exposure to dust while mulching leaves. The cough is productive with yellow-green sputum and associated with wheezing, particularly in the mornings. No chest tightness reported, but the cough is severe enough to cause incontinence. Asthma increases risk of decompensation. Albuterol  inhaler used intermittently with some relief. Differential includes acute bronchitis exacerbated by asthma. Lungs sound clear on examination, and no indication for a chest x-ray given the upcoming CT scan. - Prescribe Augmentin  and prednisone 20 mg for 5 days - Continue albuterol  inhaler 2 puffs every 4-6 hours as needed  Gastroesophageal reflux disease  Severe GERD exacerbated by a recent episode of aspiration. Currently on pantoprazole twice daily and Carafate at night, which have been effective in managing symptoms. Reports  that certain foods and drinks, such as wine, exacerbate  reflux. GLP-1 receptor agonist, Ozempic , used for diabetes, potentially worsens reflux due to delayed gastric emptying. - Continue pantoprazole twice daily - Continue Carafate at night - Consider adding famotidine at bedtime, advised patient follow up with GI  - Provide dietary guidance to avoid GERD triggers such as citrus, spicy foods, chocolate, and caffeine - Advise not to lie down for two hours after eating  Type 2 diabetes mellitus Long-standing type 2 diabetes mellitus, well-managed with Ozempic , which maintains her A1c without causing hypoglycemia. Previously experienced hypoglycemia with glipizide, which has been discontinued. - Continue Ozempic  as prescribed  Pulmonary nodule, right upper lobe, under surveillance A pulmonary nodule was identified in the right upper lobe during a recent hospitalization for renal sepsis. The nodule appeared inflammatory or infectious in nature, and a follow-up CT scan is scheduled for November 3rd to monitor any changes.   Almarie LELON Ferrari, NP 02/20/2024

## 2024-03-07 ENCOUNTER — Ambulatory Visit: Payer: Medicare Other | Admitting: Internal Medicine

## 2024-03-10 ENCOUNTER — Encounter: Payer: Self-pay | Admitting: Behavioral Health

## 2024-03-10 ENCOUNTER — Ambulatory Visit
Admission: RE | Admit: 2024-03-10 | Discharge: 2024-03-10 | Disposition: A | Source: Ambulatory Visit | Attending: Family Medicine | Admitting: Family Medicine

## 2024-03-10 ENCOUNTER — Ambulatory Visit (INDEPENDENT_AMBULATORY_CARE_PROVIDER_SITE_OTHER): Admitting: Behavioral Health

## 2024-03-10 DIAGNOSIS — F411 Generalized anxiety disorder: Secondary | ICD-10-CM

## 2024-03-10 DIAGNOSIS — F410 Panic disorder [episodic paroxysmal anxiety] without agoraphobia: Secondary | ICD-10-CM

## 2024-03-10 DIAGNOSIS — F5105 Insomnia due to other mental disorder: Secondary | ICD-10-CM | POA: Diagnosis not present

## 2024-03-10 DIAGNOSIS — F331 Major depressive disorder, recurrent, moderate: Secondary | ICD-10-CM | POA: Diagnosis not present

## 2024-03-10 DIAGNOSIS — F99 Mental disorder, not otherwise specified: Secondary | ICD-10-CM | POA: Diagnosis not present

## 2024-03-10 DIAGNOSIS — R918 Other nonspecific abnormal finding of lung field: Secondary | ICD-10-CM | POA: Diagnosis not present

## 2024-03-10 DIAGNOSIS — R911 Solitary pulmonary nodule: Secondary | ICD-10-CM

## 2024-03-10 MED ORDER — ZOLPIDEM TARTRATE 10 MG PO TABS: 5.0000 mg | ORAL_TABLET | Freq: Every evening | ORAL | 1 refills | Status: AC | PRN

## 2024-03-10 MED ORDER — ALPRAZOLAM 0.5 MG PO TABS: 0.5000 mg | ORAL_TABLET | Freq: Three times a day (TID) | ORAL | 1 refills | Status: AC | PRN

## 2024-03-10 MED ORDER — FLUOXETINE HCL 20 MG PO CAPS: 20.0000 mg | ORAL_CAPSULE | Freq: Every day | ORAL | 1 refills | Status: AC

## 2024-03-10 MED ORDER — QUETIAPINE FUMARATE 25 MG PO TABS: 75.0000 mg | ORAL_TABLET | Freq: Every evening | ORAL | 3 refills | Status: DC

## 2024-03-10 MED ORDER — FLUOXETINE HCL 40 MG PO CAPS: 40.0000 mg | ORAL_CAPSULE | Freq: Every day | ORAL | 1 refills | Status: AC

## 2024-03-10 NOTE — Progress Notes (Signed)
 [facility]  Patient ID: VELEDA MUN,  MRN: [id number]  PCP: Kip Righter, MD  Date of Evaluation: 03/10/2024 Time spent:30 minutes  Chief Complaint:  Chief Complaint   Anxiety; Depression; Follow-up; Medication Refill; Patient Education     HISTORY/CURRENT STATUS: HPI  Debbie Norris, 73 year old female presents to this for follow up and medication management.  Collateral information should be considered reliable.  Reports life has been very challenging recently.  She was hospitalized for UTI and sepsis a couple months ago.  Husband has been diagnosed with multiple health issues.  For now says that her psychiatric medications are working well and she is requesting no adjustments or changes today.  Trying to stay busy and take some time for healthy distractions.  She is now getting average of 6-7 hours per night. She rates her anxiety today 4/10, and depression at 3/10.  She denies mania, no psychosis, no auditory or visual hallucinations.  No ETOH since December.  Says that she consumes at the same time every day usually around dinnertime.  She denies SI or HI.   Prior psychiatric medication trials: Prozac  Hydroxyzine  Ambien  Xanax         Individual Medical History/ Review of Systems: Changes? :No   Allergies: Cream base, Ketorolac tromethamine, Pineapple, Sevoflurane, Atorvastatin, Benicar [olmesartan], Lisinopril, Metformin and related, Other, Phenergan [promethazine hcl], and Valdecoxib  Current Medications:  Current Outpatient Medications:    albuterol  (PROVENTIL  HFA) 108 (90 Base) MCG/ACT inhaler, TAKE 2 PUFFS BY MOUTH EVERY 4 TO 6 HOURS AS NEEDED, Disp: 6.7 each, Rfl: 11   ALPRAZolam  (XANAX ) 0.5 MG tablet, Take 1 tablet (0.5 mg total) by mouth 3 (three) times daily as needed., Disp: 90 tablet, Rfl: 1   amoxicillin -clavulanate (AUGMENTIN ) 875-125 MG tablet, Take 1 tablet by mouth as needed (sinus infection)., Disp: , Rfl:    amoxicillin -clavulanate  (AUGMENTIN ) 875-125 MG tablet, Take 1 tablet by mouth 2 (two) times daily., Disp: 14 tablet, Rfl: 0   Calcium  Carbonate Antacid (TUMS PO), Take 1,000 mg by mouth 3 (three) times daily., Disp: , Rfl:    cetirizine (ZYRTEC) 10 MG chewable tablet, Chew 10 mg by mouth as needed for allergies or rhinitis., Disp: , Rfl:    cloNIDine  (CATAPRES ) 0.1 MG tablet, TAKE ONE TABLET BY MOUTH AT BEDTIME AND THEN ONE TABLET IN THE MORNING WHEN WAKENING., Disp: 180 tablet, Rfl: 0   Coenzyme Q10 (Q-10 CO-ENZYME PO), Take 300 mg by mouth daily., Disp: , Rfl:    cyclobenzaprine (FLEXERIL) 10 MG tablet, Take 10 mg by mouth every 8 (eight) hours as needed for muscle spasms., Disp: , Rfl:    diclofenac sodium (VOLTAREN) 1 % GEL, As directed, Disp: , Rfl: 1   Evolocumab  (REPATHA  SURECLICK) 140 MG/ML SOAJ, Inject 140 mg into the skin every 14 (fourteen) days., Disp: 2 mL, Rfl: 11   Ferrous Sulfate (SLOW FE PO), Take 1 tablet by mouth daily., Disp: , Rfl:    fluconazole  (DIFLUCAN ) 150 MG tablet, Take 150 mg by mouth as needed (with antibiotics)., Disp: , Rfl:    FLUoxetine  (PROZAC ) 20 MG capsule, Take 1 capsule (20 mg total) by mouth daily., Disp: 90 capsule, Rfl: 1   FLUoxetine  (PROZAC ) 40 MG capsule, Take 1 capsule (40 mg total) by mouth daily., Disp: 90 capsule, Rfl: 1   fluticasone  (FLONASE) 50 MCG/ACT nasal spray, Place 2 sprays into both nostrils 2 (two) times daily. , Disp: , Rfl:    hydrOXYzine  (ATARAX ) 25 MG tablet, TAKE 1 TABLET BY MOUTH THREE  TIMES A DAY AS NEEDED, Disp: 270 tablet, Rfl: 0   hydrOXYzine  (ATARAX ) 50 MG tablet, TAKE 1/2 TO 1 TABLET BY MOUTH TWICE A DAY AS NEEDED FOR ANXIETY (Patient not taking: Reported on 02/20/2024), Disp: 180 tablet, Rfl: 1   OZEMPIC , 0.25 OR 0.5 MG/DOSE, 2 MG/3ML SOPN, SMARTSIG:0.25 Milligram(s) SUB-Q Once a Week (Patient not taking: Reported on 02/20/2024), Disp: , Rfl:    pantoprazole (PROTONIX) 40 MG tablet, Take 40 mg by mouth 2 (two) times daily at 10 am and 4 pm. , Disp: ,  Rfl:    predniSONE (DELTASONE) 20 MG tablet, Take 1 tablet daily x 7 days, Disp: 7 tablet, Rfl: 0   QUEtiapine  (SEROQUEL ) 100 MG tablet, Take 1/2-1 tablet by mouth at bedtime daily for sleep and anxiety, Disp: 90 tablet, Rfl: 1   QUEtiapine  (SEROQUEL ) 25 MG tablet, Take 3 tablets (75 mg total) by mouth at bedtime., Disp: 90 tablet, Rfl: 3   Semaglutide  (OZEMPIC , 0.25 OR 0.5 MG/DOSE, [place]), Inject 0.5 mg into the skin every 7 (seven) days. (Patient not taking: Reported on 02/20/2024), Disp: , Rfl:    Semaglutide -Weight Management 0.25 MG/0.5ML SOAJ, Inject 0.25 mg into the skin., Disp: , Rfl:    VAGIFEM 10 MCG TABS vaginal tablet, Place 1 tablet vaginally 2 (two) times a week. , Disp: , Rfl: 3   vitamin B-12 (CYANOCOBALAMIN ) 1000 MCG tablet, Take 1,000 mcg by mouth daily., Disp: , Rfl:    Vitamin D, Ergocalciferol, (DRISDOL) 50000 UNITS CAPS capsule, Take 1,000 Units by mouth daily., Disp: , Rfl:    zolpidem  (AMBIEN ) 10 MG tablet, Take 0.5-1 tablets (5-10 mg total) by mouth at bedtime as needed., Disp: 90 tablet, Rfl: 1 Medication Side Effects: none  Family Medical/ Social History: Changes? No  MENTAL HEALTH EXAM:  There were no vitals taken for this visit.There is no height or weight on file to calculate BMI.  General Appearance: Casual, Neat, and Well Groomed  Eye Contact:  Good  Speech:  Clear and Coherent  Volume:  Normal  Mood:  NA  Affect:  Appropriate  Thought Process:  Coherent  Orientation:  Full (Time, Place, and Person)  Thought Content: Logical   Suicidal Thoughts:  No  Homicidal Thoughts:  No  Memory:  WNL  Judgement:  Good  Insight:  Good  Psychomotor Activity:  Normal  Concentration:  Concentration: Good  Recall:  Good  Fund of Knowledge: Good  Language: Good  Assets:  Desire for Improvement  ADL's:  Intact  Cognition: WNL  Prognosis:  Good    DIAGNOSES:    ICD-10-CM   1. Generalized anxiety disorder  F41.1 QUEtiapine  (SEROQUEL ) 25 MG tablet    ALPRAZolam   (XANAX ) 0.5 MG tablet    FLUoxetine  (PROZAC ) 20 MG capsule    FLUoxetine  (PROZAC ) 40 MG capsule    2. Panic attack  F41.0 ALPRAZolam  (XANAX ) 0.5 MG tablet    FLUoxetine  (PROZAC ) 20 MG capsule    FLUoxetine  (PROZAC ) 40 MG capsule    3. Insomnia due to other mental disorder  F51.05 zolpidem  (AMBIEN ) 10 MG tablet   F99 QUEtiapine  (SEROQUEL ) 25 MG tablet    4. Major depressive disorder, recurrent episode, moderate (HCC)  F33.1 FLUoxetine  (PROZAC ) 20 MG capsule    FLUoxetine  (PROZAC ) 40 MG capsule      Receiving Psychotherapy: No    RECOMMENDATIONS:   Greater than 50% of 30 min face to face time with patient was spent on counseling and coordination of care.  We reviewed her progress  with anxiety and depression. Overall happy with her medications.  Discussed her continued concerns about relationship dynamics with her husband.  Now dealing with a lot of health issues with him and new stressors.  However for now she is happy with her current medication regimen and does not wish to make any changes today.    We agreed to:   To continue Prozac  to 60 mg daily in the a.m. To continue Xanax  0.5 mg 2 times daily as needed only for severe anxiety.   Continue Seroquel  25 mg at bedtime. May increase to 50 mg if not effective. To continue hydroxyzine  25 mg 3 times daily as needed To continue clonidine  0.1 mg at bedtime, and then another 0.1 mg in the morning when awakening. Continue  Ambien  to 5 mg if possible. She is taking one half at bedtime and another half when she wakes up to urinate later in the night.  Will report side effects or worsening symptoms Provided emergency contact information Reviewed PDMP        Debbie DELENA Pizza, NP

## 2024-03-17 ENCOUNTER — Ambulatory Visit

## 2024-03-18 DIAGNOSIS — N3090 Cystitis, unspecified without hematuria: Secondary | ICD-10-CM | POA: Diagnosis not present

## 2024-03-18 DIAGNOSIS — R399 Unspecified symptoms and signs involving the genitourinary system: Secondary | ICD-10-CM | POA: Diagnosis not present

## 2024-03-18 DIAGNOSIS — N760 Acute vaginitis: Secondary | ICD-10-CM | POA: Diagnosis not present

## 2024-03-19 ENCOUNTER — Ambulatory Visit (INDEPENDENT_AMBULATORY_CARE_PROVIDER_SITE_OTHER)

## 2024-03-19 VITALS — BP 94/56 | HR 61 | Temp 98.6°F | Ht 61.0 in | Wt 151.0 lb

## 2024-03-19 DIAGNOSIS — R911 Solitary pulmonary nodule: Secondary | ICD-10-CM

## 2024-03-19 DIAGNOSIS — Z23 Encounter for immunization: Secondary | ICD-10-CM

## 2024-03-19 DIAGNOSIS — R053 Chronic cough: Secondary | ICD-10-CM

## 2024-03-19 LAB — POCT EXHALED NITRIC OXIDE: FeNO level (ppb): 24

## 2024-03-19 NOTE — Progress Notes (Signed)
 Subjective:   PATIENT ID: Debbie Norris GENDER: female DOB: 02-Jan-1951, MRN: [id number]   HPI Discussed the use of AI scribe software for clinical note transcription with the patient, who gave verbal consent to proceed.  History of Present Illness Debbie Norris is a 73 year old female with asthma who presents with persistent cough and recent fever. She has a history of asthma, previously managed with an inhaler and Advair, which improved after addressing sinus issues. Currently, she uses albuterol  as needed. She has experienced a persistent cough, which worsened after mowing grass and leaves on November 15th, accompanied by thick mucus production and night chills. She was prescribed antibiotics and prednisone, initially feeling they were ineffective but now reports improvement.  She was hospitalized from the end of July to the beginning of August for a urinary tract infection. During this hospitalization, a chest x-ray was negative, but a CT scan revealed a 10 mm lung nodule in the right upper lobe. A follow-up CT scan on November 3rd showed the same nodule in the right upper lobe, with radiologist measurements of 10 mm in August and 9 mm in November.  She experienced a fever of 101.58F two days ago, with current temperatures around 99.58F. Initially suspecting another urinary tract infection, she was evaluated by a urologist who found no infection. She has a history of pneumonia, typically affecting one side, and reports current symptoms similar to past episodes, including unusual night chills.  No smoking history, stating she has only tried cigarettes twice in her life and did not continue due to choking. Her current medications include albuterol  as needed, and she has previously used Singulair , which was discontinued due to potential psychiatric side effects.     Past Medical History:  Diagnosis Date   Abdominal pain    Asthma    Atrophic vaginitis    Cancer (HCC)    basel  cell   Complication of anesthesia    Depression    Diabetes (HCC)    High blood pressure    Hx of colonic polyps    Hyperlipidemia    Obesity    GASTRIC BYPASS IN 2006   PONV (postoperative nausea and vomiting)    with inhaled anesthetic    Vitamin D deficiency      Family History  Problem Relation Age of Onset   Anxiety disorder Mother    Depression Mother    Heart disease Mother    Depression Father    Anxiety disorder Father    Heart disease Father    Cancer Father        Basel Cell   Depression Sister    Anxiety disorder Sister    Emphysema Paternal Aunt    Breast cancer Neg Hx      Social History   Socioeconomic History   Marital status: Married    Spouse name: Chyrl   Number of children: 0   Years of education: 18   Highest education level: Bachelor's degree (e.g., BA, AB, BS)  Occupational History   Occupation: Retired  Tobacco Use   Smoking status: Never   Smokeless tobacco: Never  Vaping Use   Vaping status: Never Used  Substance and Sexual Activity   Alcohol use: Yes    Comment: social drinker   Drug use: No   Sexual activity: Yes  Other Topics Concern   Not on file  Social History Narrative   L[place] in [place] with husband. Enjoys camping, sailing, boating in free time.  Social Drivers of Corporate Investment Banker Strain: Not on file  Food Insecurity: Not on file  Transportation Needs: Not on file  Physical Activity: Not on file  Stress: Not on file  Social Connections: Not on file  Intimate Partner Violence: Not on file     Allergies  Allergen Reactions   Cream Base Rash   Ketorolac Tromethamine Other (See Comments)    swelling   Pineapple Hives and Other (See Comments)    Stomach cramps   Sevoflurane Nausea And Vomiting    Patient states had immediate projectile vomiting.  1998 and 2013   Atorvastatin Other (See Comments)    Muscle pain, high level CPK   Benicar [Olmesartan]    Lisinopril Cough, Other (See Comments)  and Nausea And Vomiting   Metformin And Related     Muscle pain/cramps   Other     All vaginal / genital creams: causes severe rash with itching   Phenergan [Promethazine Hcl]     Itchy    Valdecoxib Swelling and Other (See Comments)     Outpatient Medications Prior to Visit  Medication Sig Dispense Refill   albuterol  (PROVENTIL  HFA) 108 (90 Base) MCG/ACT inhaler TAKE 2 PUFFS BY MOUTH EVERY 4 TO 6 HOURS AS NEEDED 6.7 each 11   ALPRAZolam  (XANAX ) 0.5 MG tablet Take 1 tablet (0.5 mg total) by mouth 3 (three) times daily as needed. 90 tablet 1   amoxicillin -clavulanate (AUGMENTIN ) 875-125 MG tablet Take 1 tablet by mouth as needed (sinus infection).     amoxicillin -clavulanate (AUGMENTIN ) 875-125 MG tablet Take 1 tablet by mouth 2 (two) times daily. 14 tablet 0   Calcium  Carbonate Antacid (TUMS PO) Take 1,000 mg by mouth 3 (three) times daily.     cetirizine (ZYRTEC) 10 MG chewable tablet Chew 10 mg by mouth as needed for allergies or rhinitis.     cloNIDine  (CATAPRES ) 0.1 MG tablet TAKE ONE TABLET BY MOUTH AT BEDTIME AND THEN ONE TABLET IN THE MORNING WHEN WAKENING. 180 tablet 0   Coenzyme Q10 (Q-10 CO-ENZYME PO) Take 300 mg by mouth daily.     cyclobenzaprine (FLEXERIL) 10 MG tablet Take 10 mg by mouth every 8 (eight) hours as needed for muscle spasms.     diclofenac sodium (VOLTAREN) 1 % GEL As directed  1   Evolocumab  (REPATHA  SURECLICK) 140 MG/ML SOAJ Inject 140 mg into the skin every 14 (fourteen) days. 2 mL 11   Ferrous Sulfate (SLOW FE PO) Take 1 tablet by mouth daily.     fluconazole  (DIFLUCAN ) 150 MG tablet Take 150 mg by mouth as needed (with antibiotics).     FLUoxetine  (PROZAC ) 20 MG capsule Take 1 capsule (20 mg total) by mouth daily. 90 capsule 1   FLUoxetine  (PROZAC ) 40 MG capsule Take 1 capsule (40 mg total) by mouth daily. 90 capsule 1   fluticasone  (FLONASE) 50 MCG/ACT nasal spray Place 2 sprays into both nostrils 2 (two) times daily.      hydrOXYzine  (ATARAX ) 25 MG  tablet TAKE 1 TABLET BY MOUTH THREE TIMES A DAY AS NEEDED 270 tablet 0   hydrOXYzine  (ATARAX ) 50 MG tablet TAKE 1/2 TO 1 TABLET BY MOUTH TWICE A DAY AS NEEDED FOR ANXIETY (Patient taking differently: Take 25 mg by mouth as needed.) 180 tablet 1   OZEMPIC , 0.25 OR 0.5 MG/DOSE, 2 MG/3ML SOPN SMARTSIG:0.25 Milligram(s) SUB-Q Once a Week (Patient taking differently: 0.5 mLs.)     pantoprazole (PROTONIX) 40 MG tablet Take 40 mg by  mouth 2 (two) times daily at 10 am and 4 pm.      predniSONE (DELTASONE) 20 MG tablet Take 1 tablet daily x 7 days 7 tablet 0   QUEtiapine  (SEROQUEL ) 100 MG tablet Take 1/2-1 tablet by mouth at bedtime daily for sleep and anxiety 90 tablet 1   QUEtiapine  (SEROQUEL ) 25 MG tablet Take 3 tablets (75 mg total) by mouth at bedtime. 90 tablet 3   Semaglutide  (OZEMPIC , 0.25 OR 0.5 MG/DOSE, [place]) Inject 0.5 mg into the skin every 7 (seven) days.     Semaglutide -Weight Management 0.25 MG/0.5ML SOAJ Inject 0.25 mg into the skin.     VAGIFEM 10 MCG TABS vaginal tablet Place 1 tablet vaginally 2 (two) times a week.   3   vitamin B-12 (CYANOCOBALAMIN ) 1000 MCG tablet Take 1,000 mcg by mouth daily.     Vitamin D, Ergocalciferol, (DRISDOL) 50000 UNITS CAPS capsule Take 1,000 Units by mouth daily.     zolpidem  (AMBIEN ) 10 MG tablet Take 0.5-1 tablets (5-10 mg total) by mouth at bedtime as needed. 90 tablet 1   No facility-administered medications prior to visit.    ROS Reviewed all systems and reported negative except as above     Objective:   Vitals:   03/19/24 1305  BP: (!) 94/56  Pulse: 61  Temp: 98.6 F (37 C)  TempSrc: Oral  SpO2: 99%  Weight: 151 lb (68.5 kg)  Height: 5' 1 (1.549 m)    Physical Exam Physical Exam GENERAL: Appropriate to age, no acute distress. HEAD EYES EARS NOSE THROAT: Moist mucous membranes, atraumatic, normocephalic. CHEST: Clear to auscultation bilaterally, no wheezing, no crackles, no rales. CARDIAC: Regular rate and rhythm, normal S1, normal  S2, no murmurs, no rubs, no gallops. ABDOMEN: Soft, nontender. NEUROLOGICAL: Motor and sensation grossly intact, alert and oriented times X 3. EXTREMITIES: Warm, well perfused, no edema.     CBC    Component Value Date/Time   WBC 7.0 01/02/2017 1047   RBC 4.43 01/02/2017 1047   HGB 12.5 01/02/2017 1047   HCT 38.4 01/02/2017 1047   PLT 253.0 01/02/2017 1047   MCV 86.7 01/02/2017 1047   MCHC 32.6 01/02/2017 1047   RDW 14.2 01/02/2017 1047   LYMPHSABS 2.1 01/02/2017 1047   MONOABS 0.6 01/02/2017 1047   EOSABS 0.2 01/02/2017 1047   BASOSABS 0.1 01/02/2017 1047     Chest imaging: I reviewed her CT chest performed in November and compared it to her CT chest performed in her hospitalization end of July beginning of August.  Both nodules appear similar in diameter of about 1 cm but the CAT scan in November shows that the nodule appears slightly bulkier and it is a second dimension.   PFT:    Latest Ref Rng & Units 01/02/2017   10:30 AM 11/25/2013    1:02 PM  PFT Results  FVC-Pre L 2.97  3.10   FVC-Predicted Pre % 106  107   FVC-Post L  3.10   FVC-Predicted Post %  107   Pre FEV1/FVC % % 81  81   Post FEV1/FCV % %  80   FEV1-Pre L 2.41  2.53   FEV1-Predicted Pre % 112  113   FEV1-Post L  2.50   DLCO uncorrected ml/min/mmHg  22.39   DLCO UNC% %  110   DLVA Predicted %  114   TLC L  4.94   TLC % Predicted %  107   RV % Predicted %  44  Assessment & Plan:   Assessment and Plan Assessment & Plan Solitary pulmonary nodule, right upper lobe Solitary pulmonary nodule in right upper lobe, initially 10 mm, appears slightly larger on recent CT compared to CT from August.  - Ordered PET scan to evaluate nodule. - If PET scan shows activity, proceed with biopsy via robotic bronchoscopy. - If PET scan negative, consider follow-up CT scan in three months versus biopsy.  Chronic cough with sputum production and recent fever Chronic cough with sputum production and  recent fever, initially thought to be pneumonia. Symptoms improved with antibiotics and prednisone, suggesting infectious etiology. Recent fever concerning as infection should have resolved. - Continue current management as symptoms are improving.  Asthma versus reactive airway disease under evaluation Symptoms exacerbated after exposure to mulch and leaves, suggesting reactive airway disease. Previous asthma managed with inhalers, currently only using albuterol . - Feno was only 12 - Continue albuterol  as needed.        Zola Herter, MD [facility] Office: [phone]

## 2024-03-19 NOTE — Patient Instructions (Signed)
  VISIT SUMMARY: During today's visit, we discussed your persistent cough, recent fever, and the lung nodule found in your right upper lobe. We reviewed your history of asthma and recent hospitalization for a urinary tract infection. We also discussed your current medications and symptoms.  YOUR PLAN: -SOLITARY PULMONARY NODULE, RIGHT UPPER LOBE: A solitary pulmonary nodule is a small, round growth in the lung that can be benign or malignant. We have ordered a PET scan to evaluate the nodule. If the PET scan shows activity, we will proceed with a biopsy using robotic bronchoscopy. If the PET scan is negative, we will consider a follow-up CT scan in three months.  -CHRONIC COUGH WITH SPUTUM PRODUCTION AND RECENT FEVER: Your chronic cough with sputum production and recent fever was initially thought to be pneumonia. Since your symptoms have improved with antibiotics and prednisone, it suggests an infection. We will continue with the current management as your symptoms are improving.  -ASTHMA VERSUS REACTIVE AIRWAY DISEASE UNDER EVALUATION: Asthma and reactive airway disease involve inflammation and narrowing of the airways, causing difficulty in breathing. Your symptoms worsened after exposure to mulch and leaves, suggesting reactive airway disease. We performed a pheno test to assess inflammation which was low.    INSTRUCTIONS: Please follow up for the PET scan as scheduled. If the PET scan shows activity, we will proceed with a biopsy. If it is negative, we will plan for a follow-up CT scan in three months. Continue your current medications and management plan. If you experience any worsening of symptoms or new symptoms, please contact our office immediately.

## 2024-03-23 ENCOUNTER — Other Ambulatory Visit: Payer: Self-pay | Admitting: Behavioral Health

## 2024-03-23 DIAGNOSIS — F411 Generalized anxiety disorder: Secondary | ICD-10-CM

## 2024-03-23 DIAGNOSIS — F5105 Insomnia due to other mental disorder: Secondary | ICD-10-CM

## 2024-03-24 ENCOUNTER — Other Ambulatory Visit: Payer: Self-pay | Admitting: Behavioral Health

## 2024-03-24 DIAGNOSIS — N1339 Other hydronephrosis: Secondary | ICD-10-CM | POA: Diagnosis not present

## 2024-03-24 DIAGNOSIS — F5105 Insomnia due to other mental disorder: Secondary | ICD-10-CM

## 2024-03-24 DIAGNOSIS — N3020 Other chronic cystitis without hematuria: Secondary | ICD-10-CM | POA: Diagnosis not present

## 2024-03-24 DIAGNOSIS — F411 Generalized anxiety disorder: Secondary | ICD-10-CM

## 2024-03-24 NOTE — Telephone Encounter (Signed)
 LF 11/3, LV 11/3, FU 06/17/24  Would deny this RX, it has refills [facility] [address]

## 2024-03-25 ENCOUNTER — Telehealth: Payer: Self-pay

## 2024-03-25 NOTE — Telephone Encounter (Signed)
 Copied from CRM #[id number]. Topic: Clinical - Request for Lab/Test Order >> Mar 25, 2024 10:54 AM Debbie Norris wrote: Reason for CRM: Pt would like to get her head included in her PET scan on Friday. Pt stated she thinks she has a sinus infection and would like to have it included. Pt stated she [place] sin[place]. Pt would like a callback regarding including her head in the PET scan. Pt has an appt on Friday, November 21st.   Dr Zaida, please advise

## 2024-03-25 NOTE — Telephone Encounter (Signed)
 PT due for refill,  #180

## 2024-03-26 DIAGNOSIS — E1169 Type 2 diabetes mellitus with other specified complication: Secondary | ICD-10-CM | POA: Diagnosis not present

## 2024-03-28 ENCOUNTER — Ambulatory Visit (HOSPITAL_COMMUNITY)
Admission: RE | Admit: 2024-03-28 | Discharge: 2024-03-28 | Disposition: A | Discharge status: Home / Self Care | Source: Ambulatory Visit

## 2024-03-28 DIAGNOSIS — R911 Solitary pulmonary nodule: Secondary | ICD-10-CM | POA: Insufficient documentation

## 2024-03-28 LAB — GLUCOSE, CAPILLARY: Glucose-Capillary: 130 mg/dL — ABNORMAL HIGH (ref 70–99)

## 2024-03-28 MED ORDER — FLUDEOXYGLUCOSE F - 18 (FDG) INJECTION
7.0000 | Freq: Once | INTRAVENOUS | Status: AC
  Administered 2024-03-28: 7.5 via INTRAVENOUS

## 2024-03-31 ENCOUNTER — Telehealth: Payer: Self-pay

## 2024-03-31 ENCOUNTER — Ambulatory Visit: Payer: Self-pay

## 2024-03-31 DIAGNOSIS — R911 Solitary pulmonary nodule: Secondary | ICD-10-CM

## 2024-03-31 NOTE — Telephone Encounter (Signed)
 error

## 2024-04-01 ENCOUNTER — Other Ambulatory Visit (HOSPITAL_COMMUNITY): Payer: Self-pay | Admitting: Urology

## 2024-04-01 ENCOUNTER — Other Ambulatory Visit: Payer: Self-pay | Admitting: Behavioral Health

## 2024-04-01 DIAGNOSIS — F411 Generalized anxiety disorder: Secondary | ICD-10-CM

## 2024-04-01 DIAGNOSIS — F5105 Insomnia due to other mental disorder: Secondary | ICD-10-CM

## 2024-04-01 DIAGNOSIS — N1330 Unspecified hydronephrosis: Secondary | ICD-10-CM

## 2024-04-11 ENCOUNTER — Encounter (HOSPITAL_COMMUNITY)
Admission: RE | Admit: 2024-04-11 | Discharge: 2024-04-11 | Disposition: A | Source: Ambulatory Visit | Attending: Urology

## 2024-04-11 DIAGNOSIS — A419 Sepsis, unspecified organism: Secondary | ICD-10-CM | POA: Diagnosis not present

## 2024-04-11 DIAGNOSIS — N1330 Unspecified hydronephrosis: Secondary | ICD-10-CM

## 2024-04-11 MED ORDER — FUROSEMIDE 10 MG/ML IJ SOLN
INTRAMUSCULAR | Status: AC
  Filled 2024-04-11: qty 4

## 2024-04-11 MED ORDER — TECHNETIUM TC 99M MERTIATIDE
5.0000 | Freq: Once | INTRAVENOUS | Status: AC | PRN
  Administered 2024-04-11: 5.1 via INTRAVENOUS

## 2024-04-11 MED ORDER — FUROSEMIDE 10 MG/ML IJ SOLN
34.0000 mg | Freq: Once | INTRAMUSCULAR | Status: AC
  Administered 2024-04-11: 34 mg via INTRAVENOUS

## 2024-04-22 DIAGNOSIS — Z0000 Encounter for general adult medical examination without abnormal findings: Secondary | ICD-10-CM | POA: Diagnosis not present

## 2024-04-22 DIAGNOSIS — E785 Hyperlipidemia, unspecified: Secondary | ICD-10-CM | POA: Diagnosis not present

## 2024-04-22 DIAGNOSIS — E559 Vitamin D deficiency, unspecified: Secondary | ICD-10-CM | POA: Diagnosis not present

## 2024-04-22 DIAGNOSIS — Z1159 Encounter for screening for other viral diseases: Secondary | ICD-10-CM | POA: Diagnosis not present

## 2024-04-22 DIAGNOSIS — E1165 Type 2 diabetes mellitus with hyperglycemia: Secondary | ICD-10-CM | POA: Diagnosis not present

## 2024-05-23 ENCOUNTER — Ambulatory Visit (INDEPENDENT_AMBULATORY_CARE_PROVIDER_SITE_OTHER): Admitting: Behavioral Health

## 2024-05-23 ENCOUNTER — Encounter: Payer: Self-pay | Admitting: Behavioral Health

## 2024-05-23 DIAGNOSIS — F331 Major depressive disorder, recurrent, moderate: Secondary | ICD-10-CM | POA: Diagnosis not present

## 2024-05-23 DIAGNOSIS — F5105 Insomnia due to other mental disorder: Secondary | ICD-10-CM | POA: Diagnosis not present

## 2024-05-23 DIAGNOSIS — F411 Generalized anxiety disorder: Secondary | ICD-10-CM | POA: Diagnosis not present

## 2024-05-23 DIAGNOSIS — F99 Mental disorder, not otherwise specified: Secondary | ICD-10-CM | POA: Diagnosis not present

## 2024-05-23 NOTE — Progress Notes (Signed)
 "     [facility]  Patient ID: Debbie Norris,  MRN: [id number]  PCP: Kip Righter, MD  Date of Evaluation: 05/23/2024 Time spent:30 minutes  Chief Complaint:  Chief Complaint   Depression; Anxiety; Follow-up; Medication Refill; Patient Education; Family Problem; Stress     HISTORY/CURRENT STATUS: HPI Debbie Norris, 74 year old female presents to this for follow up and medication management.  Collateral information should be considered reliable.  She says that she has been doing okay overall.  Still having some challenging relationship issues with her husband.  Adjusting to the dynamics of aging and finding balance.   She is learning boundaries and how to take time for self-care.  She is now getting average of 6-7 hours per night. She rates her anxiety today 3/10, and depression at 3/10.  She denies mania, no psychosis, no auditory or visual hallucinations.  No ETOH since December.  Says that she consumes at the same time every day usually around dinnertime.  She denies SI or HI.    Prior psychiatric medication trials: Prozac  Hydroxyzine  Ambien  Xanax             Individual Medical History/ Review of Systems: Changes? :No   Allergies: Cream base, Ketorolac tromethamine, Pineapple, Sevoflurane, Atorvastatin, Benicar [olmesartan], Lisinopril, Metformin and related, Other, Phenergan [promethazine hcl], and Valdecoxib  Current Medications: Current Medications[1] Medication Side Effects: none  Family Medical/ Social History: Changes? No  MENTAL HEALTH EXAM:  There were no vitals taken for this visit.There is no height or weight on file to calculate BMI.  General Appearance: Casual, Neat, and Well Groomed  Eye Contact:  Good  Speech:  Clear and Coherent  Volume:  Normal  Mood:  NA  Affect:  Appropriate  Thought Process:  Coherent  Orientation:  Full (Time, Place, and Person)  Thought Content: Logical   Suicidal Thoughts:  No  Homicidal Thoughts:  No  Memory:  WNL   Judgement:  Good  Insight:  Good  Psychomotor Activity:  Normal  Concentration:  Concentration: Good  Recall:  Good  Fund of Knowledge: Good  Language: Good  Assets:  Desire for Improvement  ADL's:  Intact  Cognition: WNL  Prognosis:  Good    DIAGNOSES:    ICD-10-CM   1. Generalized anxiety disorder  F41.1     2. Major depressive disorder, recurrent episode, moderate (HCC)  F33.1     3. Insomnia due to other mental disorder  F51.05    F99       Receiving Psychotherapy: No    RECOMMENDATIONS:   Greater than 50% of 30 min face to face time with patient was spent on counseling and coordination of care.  We reviewed her progress with anxiety and depression.  Overall happy with her medications.  Discussed her continued concerns about relationship dynamics with her husband.  She is trying to adjust and learn healthy coping mechanisms progress through lifestyle changes with age.  Very good about taking medications as prescribed.  No additional risk with benzodiazepine.    We agreed to:   To continue Prozac  to 60 mg daily in the a.m. To continue Xanax  0.5 mg 2 times daily as needed only for severe anxiety.   Continue Seroquel  25 mg at bedtime. May increase to 50 mg if not effective. To continue hydroxyzine  25 mg 3 times daily as needed To continue clonidine  0.1 mg at bedtime, and then another 0.1 mg in the morning when awakening. Continue  Ambien  to 5 mg if possible. She is  taking one half at bedtime and another half when she wakes up to urinate later in the night.  Will report side effects or worsening symptoms Provided emergency contact information Reviewed PDMP    Debbie DELENA Pizza, NP     [1]  Current Outpatient Medications:    albuterol  (PROVENTIL  HFA) 108 (90 Base) MCG/ACT inhaler, TAKE 2 PUFFS BY MOUTH EVERY 4 TO 6 HOURS AS NEEDED, Disp: 6.7 each, Rfl: 11   ALPRAZolam  (XANAX ) 0.5 MG tablet, Take 1 tablet (0.5 mg total) by mouth 3 (three) times daily as needed., Disp:  90 tablet, Rfl: 1   amoxicillin -clavulanate (AUGMENTIN ) 875-125 MG tablet, Take 1 tablet by mouth as needed (sinus infection)., Disp: , Rfl:    amoxicillin -clavulanate (AUGMENTIN ) 875-125 MG tablet, Take 1 tablet by mouth 2 (two) times daily., Disp: 14 tablet, Rfl: 0   Calcium  Carbonate Antacid (TUMS PO), Take 1,000 mg by mouth 3 (three) times daily., Disp: , Rfl:    cetirizine (ZYRTEC) 10 MG chewable tablet, Chew 10 mg by mouth as needed for allergies or rhinitis., Disp: , Rfl:    cloNIDine  (CATAPRES ) 0.1 MG tablet, TAKE ONE TABLET BY MOUTH AT BEDTIME AND THEN ONE TABLET IN THE MORNING WHEN WAKENING., Disp: 180 tablet, Rfl: 0   Coenzyme Q10 (Q-10 CO-ENZYME PO), Take 300 mg by mouth daily., Disp: , Rfl:    cyclobenzaprine (FLEXERIL) 10 MG tablet, Take 10 mg by mouth every 8 (eight) hours as needed for muscle spasms., Disp: , Rfl:    diclofenac sodium (VOLTAREN) 1 % GEL, As directed, Disp: , Rfl: 1   Evolocumab  (REPATHA  SURECLICK) 140 MG/ML SOAJ, Inject 140 mg into the skin every 14 (fourteen) days., Disp: 2 mL, Rfl: 11   Ferrous Sulfate (SLOW FE PO), Take 1 tablet by mouth daily., Disp: , Rfl:    fluconazole  (DIFLUCAN ) 150 MG tablet, Take 150 mg by mouth as needed (with antibiotics)., Disp: , Rfl:    FLUoxetine  (PROZAC ) 20 MG capsule, Take 1 capsule (20 mg total) by mouth daily., Disp: 90 capsule, Rfl: 1   FLUoxetine  (PROZAC ) 40 MG capsule, Take 1 capsule (40 mg total) by mouth daily., Disp: 90 capsule, Rfl: 1   fluticasone  (FLONASE) 50 MCG/ACT nasal spray, Place 2 sprays into both nostrils 2 (two) times daily. , Disp: , Rfl:    hydrOXYzine  (ATARAX ) 25 MG tablet, TAKE 1 TABLET BY MOUTH THREE TIMES A DAY AS NEEDED, Disp: 270 tablet, Rfl: 0   hydrOXYzine  (ATARAX ) 50 MG tablet, TAKE 1/2 TO 1 TABLET BY MOUTH TWICE A DAY AS NEEDED FOR ANXIETY (Patient taking differently: Take 25 mg by mouth as needed.), Disp: 180 tablet, Rfl: 1   OZEMPIC , 0.25 OR 0.5 MG/DOSE, 2 MG/3ML SOPN, SMARTSIG:0.25 Milligram(s)  SUB-Q Once a Week (Patient taking differently: 0.5 mLs.), Disp: , Rfl:    pantoprazole (PROTONIX) 40 MG tablet, Take 40 mg by mouth 2 (two) times daily at 10 am and 4 pm. , Disp: , Rfl:    predniSONE  (DELTASONE ) 20 MG tablet, Take 1 tablet daily x 7 days, Disp: 7 tablet, Rfl: 0   QUEtiapine  (SEROQUEL ) 100 MG tablet, Take 1/2-1 tablet by mouth at bedtime daily for sleep and anxiety, Disp: 90 tablet, Rfl: 1   QUEtiapine  (SEROQUEL ) 25 MG tablet, TAKE 3 TABLETS BY MOUTH AT BEDTIME., Disp: 270 tablet, Rfl: 0   Semaglutide  (OZEMPIC , 0.25 OR 0.5 MG/DOSE, [place]), Inject 0.5 mg into the skin every 7 (seven) days., Disp: , Rfl:    Semaglutide -Weight Management 0.25 MG/0.5ML SOAJ,  Inject 0.25 mg into the skin., Disp: , Rfl:    VAGIFEM 10 MCG TABS vaginal tablet, Place 1 tablet vaginally 2 (two) times a week. , Disp: , Rfl: 3   vitamin B-12 (CYANOCOBALAMIN ) 1000 MCG tablet, Take 1,000 mcg by mouth daily., Disp: , Rfl:    Vitamin D, Ergocalciferol, (DRISDOL) 50000 UNITS CAPS capsule, Take 1,000 Units by mouth daily., Disp: , Rfl:    zolpidem  (AMBIEN ) 10 MG tablet, Take 0.5-1 tablets (5-10 mg total) by mouth at bedtime as needed., Disp: 90 tablet, Rfl: 1  "

## 2024-06-17 ENCOUNTER — Ambulatory Visit: Admitting: Behavioral Health

## 2024-12-02 ENCOUNTER — Ambulatory Visit: Admitting: Behavioral Health
